# Patient Record
Sex: Male | Born: 1940 | Race: Black or African American | Hispanic: No | Smoking: Current every day smoker
Health system: Southern US, Community
[De-identification: ages and names within clinical notes are randomized; demographics above are authoritative.]

## PROBLEM LIST (undated history)

## (undated) DIAGNOSIS — N400 Enlarged prostate without lower urinary tract symptoms: Secondary | ICD-10-CM

## (undated) DIAGNOSIS — M109 Gout, unspecified: Secondary | ICD-10-CM

## (undated) DIAGNOSIS — N189 Chronic kidney disease, unspecified: Secondary | ICD-10-CM

## (undated) HISTORY — PX: CIRCUMCISION: SUR203

---

## 2001-06-01 ENCOUNTER — Other Ambulatory Visit: Admission: RE | Admit: 2001-06-01 | Discharge: 2001-06-01 | Payer: Self-pay | Admitting: Urology

## 2006-01-13 ENCOUNTER — Emergency Department (HOSPITAL_COMMUNITY): Admission: EM | Admit: 2006-01-13 | Discharge: 2006-01-13 | Payer: Self-pay | Admitting: Emergency Medicine

## 2010-03-20 ENCOUNTER — Emergency Department (HOSPITAL_COMMUNITY): Admission: EM | Admit: 2010-03-20 | Discharge: 2010-03-20 | Payer: Self-pay | Admitting: Emergency Medicine

## 2010-05-02 ENCOUNTER — Encounter: Payer: Self-pay | Admitting: Internal Medicine

## 2010-05-14 ENCOUNTER — Ambulatory Visit: Payer: Self-pay | Admitting: Internal Medicine

## 2010-05-14 ENCOUNTER — Ambulatory Visit (HOSPITAL_COMMUNITY): Admission: RE | Admit: 2010-05-14 | Discharge: 2010-05-14 | Payer: Self-pay | Admitting: Internal Medicine

## 2010-09-25 NOTE — Letter (Signed)
Summary: TRIAGE ORDER  TRIAGE ORDER   Imported By: Ave Filter 05/02/2010 16:43:15  _____________________________________________________________________  External Attachment:    Type:   Image     Comment:   External Document

## 2012-03-09 ENCOUNTER — Encounter (HOSPITAL_COMMUNITY): Payer: Self-pay | Admitting: Pharmacy Technician

## 2012-03-17 NOTE — Patient Instructions (Addendum)
20 Jesse Mccarthy  03/17/2012   Your procedure is scheduled on:  03/24/2012  Report to [facility] at  0730  AM.  Call this number if you have problems the morning of surgery: [phone]   Remember:   Do not eat food:After Midnight.  May have clear liquids:until Midnight   Take these medicines the morning of surgery with A SIP OF WATER: flomax,colchicine   Do not wear jewelry, make-up or nail polish.  Do not wear lotions, powders, or perfumes. You may wear deodorant.  Do not shave 48 hours prior to surgery. Men may shave face and neck.  Do not bring valuables to the hospital.  Contacts, dentures or bridgework may not be worn into surgery.  Leave suitcase in the car. After surgery it may be brought to your room.  For patients admitted to the hospital, checkout time is 11:00 AM the day of discharge.   Patients discharged the day of surgery will not be allowed to drive home.  Name and phone number of your driver: family  Special Instructions: CHG Shower Use Special Wash: 1/2 bottle night before surgery and 1/2 bottle morning of surgery.   Please read over the following fact sheets that you were given: Pain Booklet, MRSA Information, Surgical Site Infection Prevention, Anesthesia Post-op Instructions and Care and Recovery After Surgery PATIENT INSTRUCTIONS POST-ANESTHESIA  IMMEDIATELY FOLLOWING SURGERY:  Do not drive or operate machinery for the first twenty four hours after surgery.  Do not make any important decisions for twenty four hours after surgery or while taking narcotic pain medications or sedatives.  If you develop intractable nausea and vomiting or a severe headache please notify your doctor immediately.  FOLLOW-UP:  Please make an appointment with your surgeon as instructed. You do not need to follow up with anesthesia unless specifically instructed to do so.  WOUND CARE INSTRUCTIONS (if applicable):  Keep a dry clean dressing on the anesthesia/puncture wound site if there  is drainage.  Once the wound has quit draining you may leave it open to air.  Generally you should leave the bandage intact for twenty four hours unless there is drainage.  If the epidural site drains for more than 36-48 hours please call the anesthesia department.  QUESTIONS?:  Please feel free to call your physician or the hospital operator if you have any questions, and they will be happy to assist you.      Transurethral Resection of the Prostate Transurethral Resection of the Prostate (TURP) is often treatment for non-cancerous (benign) prostatic hyperplasia (BPH) or prostate cancer. BPH commonly begins in middle aged men and can cause symptoms at any age thereafter. The complications that stem from these problems, such as recurrent infection and bladder control and emptying problems, are often helped by this procedure. Both conditions usually cause the prostate to increase in size. TURP is a major surgery that removes part of the prostate gland. The goal is to remove enough prostate to allow for unobstructed flow of urine. TREATMENT This surgery (TURP procedure) is done using an instrument like a narrow telescope to look into your bladder. This is then used to remove enlarged pieces of your prostate, one piece at a time. This removes the blockage and makes it easier for you to urinate. This is called a transurethral resection of the prostate. A lesser procedure is sometimes done. In this procedure, small cuts are made in the prostate. This lessens the prostates pressure on the urethra. This is called a  transurethral incision (cut by the surgeon) of the prostate (TUIP). You will probably be comfortable soon after the operation, but it will take 10 to 12 weeks, or longer, for your prostate to heal after you leave the hospital.  LET YOUR CAREGIVER KNOW ABOUT:  Allergies.   Medications taken including herbs, eye drops, over the counter medications, and creams.   Use of steroids (by mouth or  creams).   Previous problems with anesthetics or Novocaine.   History of blood clots (thrombophlebitis).   History of bleeding or blood problems.   Previous surgery.   Previous prostate infections.   Other health problems.  RISKS AND COMPLICATIONS  Rare injury to the bowel (intestine).   Rare injury to the bladder or adjacent blood vessels.   Intestinal/bowel obstruction.   Scarring called "stricture" that cause later problems with the flow of urine.   Bleeding and the need for blood transfusion (more common in those with prostate cancer and those who have previously received radiation therapy).   Inability to control your urine (incontinence). This is more common in those with prostate cancer and those who have received radiation therapy.   Injury to one of the ureters (tubes that drain the kidneys into the bladder) and/or urethra (the tube that drains the bladder).   Injury to the capsule that holds the prostate. This can lead to leakage of fluid and urine into the belly (abdomen).   The operation can possibly lead to impotence. This is the inability to get an erection. Treatments are available for these types of problems.   Rare over absorption of the fluids used during the operation. This can then cause low blood levels of sodium, brain swelling, strain on the heart, and fluid accumulation in the lungs. This is more common in long procedures.   Infection.   Blood clots in the legs.   As with any major surgery, there is always the rare chance of a complicating stroke, heart attack, or other complications that will be discussed with you by the surgeon and anesthesiologist.  BEFORE THE PROCEDURE   You may be asked to temporarily adjust your diet. If so, your caregiver will give you specific recommendations.   If you are on blood thinners, stop taking them before the operation, or as your caregiver advises.   You should have nothing to eat or drink after midnight prior  to your surgery or as suggested by your caregiver. You may have a sip of water to take medications not stopped for the procedure  PROCEDURE  This operation is performed after you have been given a medication to help you sleep (anesthetic), or with a spinal block. The spinal block keeps you awake but numb from the waist down.  No cut or incision is needed. During the operation, your surgeon passes a viewing and cutting instrument (resectoscope) through the penis into the prostate gland. The instrument contains an electric cutting edge. From inside the prostate, this cutter is used to remove part of the prostate.  HOME CARE INSTRUCTIONS  For your own protection, observe the following precautions for 10 days after your operation.  You may go home with a catheter. Take care of it as directed. You will receive instruction on catheter care.   After catheter removal, empty the bladder whenever you feel a definite desire. Do not try to hold the urine for long periods of time.   For 10 days, avoid all lifting, straining, running, strenuous work, walks longer than a couple blocks, riding  in a car for extended periods, and sexual relations.   Take 2 tablespoons of heavy mineral oil or Metamucil night and morning for 3 or 4 days. After that, gradually reduce the dose to one or two teaspoons twice daily. Stop it after the stools have been normal for a week. If you become constipated, do not strain to move your bowels. You may use an enema. Notify your caregiver about problems.   Even after complete healing, you may continue to urinate once or twice during the night.   In addition to your usual medications, you may be given an antibiotic to take for 10-14 days. Notify your caregiver if you have any side effects or problems with the medication.   Avoid alcohol and caffeinated drinks for 2 weeks, as they are irritating to the bladder. Decaffeinated drinks are fine.   Eat a regular diet, avoiding spicy foods  for 2 weeks.   You may continue non-strenuous activities. It is always important to keep active after an operation. This lessens the chance of developing blood clots.   You may see some recurrence of blood in the urine after discharge from the hospital. Even a small amount of blood colors the urine very red. If this occurs, force fluids again as you did in the hospital. This is generally not a concern.  SEEK MEDICAL CARE IF:   You have chills or night sweats.   You are leaking around your catheter or have problems with your catheter.   You develop side effects that you think are coming from your medications.  SEEK IMMEDIATE MEDICAL CARE IF:   You are suddenly unable to urinate. This is an emergency.   You develop shortness of breath or chest pains.   Bleeding persists or clots develop.   You have a fever.   You develop pain in your back or over your lower belly (abdomen).   You develop pain or swelling in your legs.   You develop swelling in your abdomen or have a sudden weight gain.   The problems get worse rather than better.  Document Released: 08/12/2005 Document Revised: 08/01/2011 Document Reviewed: 02/28/2009 [facility] Patient Information 2012 [place], [place].

## 2012-03-18 ENCOUNTER — Encounter (HOSPITAL_COMMUNITY): Payer: Self-pay

## 2012-03-18 ENCOUNTER — Encounter (HOSPITAL_COMMUNITY)
Admission: RE | Admit: 2012-03-18 | Discharge: 2012-03-18 | Disposition: A | Discharge status: Home / Self Care | Payer: Medicare Other | Source: Ambulatory Visit | Attending: Urology | Admitting: Urology

## 2012-03-18 ENCOUNTER — Other Ambulatory Visit: Payer: Self-pay

## 2012-03-18 HISTORY — DX: Gout, unspecified: M10.9

## 2012-03-18 HISTORY — DX: Benign prostatic hyperplasia without lower urinary tract symptoms: N40.0

## 2012-03-18 LAB — SURGICAL PCR SCREEN: Staphylococcus aureus: NEGATIVE

## 2012-03-18 LAB — HEMOGLOBIN AND HEMATOCRIT, BLOOD: Hemoglobin: 13.4 g/dL (ref 13.0–17.0)

## 2012-03-18 LAB — BASIC METABOLIC PANEL
CO2: 19 mEq/L (ref 19–32)
Creatinine, Ser: 1.28 mg/dL (ref 0.50–1.35)
Potassium: 3.9 mEq/L (ref 3.5–5.1)

## 2012-03-23 ENCOUNTER — Other Ambulatory Visit (HOSPITAL_COMMUNITY): Payer: Self-pay | Admitting: Urology

## 2012-03-23 DIAGNOSIS — N400 Enlarged prostate without lower urinary tract symptoms: Secondary | ICD-10-CM

## 2012-03-23 DIAGNOSIS — R39198 Other difficulties with micturition: Secondary | ICD-10-CM

## 2012-03-24 ENCOUNTER — Encounter (HOSPITAL_COMMUNITY): Payer: Self-pay | Admitting: General Practice

## 2012-03-24 ENCOUNTER — Encounter (HOSPITAL_COMMUNITY): Payer: Self-pay | Admitting: Anesthesiology

## 2012-03-24 ENCOUNTER — Encounter (HOSPITAL_COMMUNITY)
Admission: RE | Disposition: A | Discharge status: Home / Self Care | Payer: Self-pay | Source: Ambulatory Visit | Attending: Urology

## 2012-03-24 ENCOUNTER — Encounter (HOSPITAL_COMMUNITY): Payer: Self-pay | Admitting: *Deleted

## 2012-03-24 ENCOUNTER — Observation Stay (HOSPITAL_COMMUNITY)
Admission: RE | Admit: 2012-03-24 | Discharge: 2012-03-25 | Disposition: A | Discharge status: Home / Self Care | Payer: Medicare Other | Source: Ambulatory Visit | Attending: Urology | Admitting: Urology

## 2012-03-24 ENCOUNTER — Ambulatory Visit (HOSPITAL_COMMUNITY)
Admission: RE | Admit: 2012-03-24 | Discharge: 2012-03-24 | Disposition: A | Discharge status: Home / Self Care | Payer: Medicare Other | Source: Ambulatory Visit | Attending: Urology | Admitting: Urology

## 2012-03-24 ENCOUNTER — Ambulatory Visit (HOSPITAL_COMMUNITY): Payer: Medicare Other | Admitting: Anesthesiology

## 2012-03-24 DIAGNOSIS — N138 Other obstructive and reflux uropathy: Principal | ICD-10-CM | POA: Insufficient documentation

## 2012-03-24 DIAGNOSIS — Z01812 Encounter for preprocedural laboratory examination: Secondary | ICD-10-CM | POA: Insufficient documentation

## 2012-03-24 DIAGNOSIS — R39198 Other difficulties with micturition: Secondary | ICD-10-CM

## 2012-03-24 DIAGNOSIS — R3916 Straining to void: Secondary | ICD-10-CM | POA: Insufficient documentation

## 2012-03-24 DIAGNOSIS — N320 Bladder-neck obstruction: Secondary | ICD-10-CM | POA: Insufficient documentation

## 2012-03-24 DIAGNOSIS — N400 Enlarged prostate without lower urinary tract symptoms: Secondary | ICD-10-CM

## 2012-03-24 DIAGNOSIS — N401 Enlarged prostate with lower urinary tract symptoms: Principal | ICD-10-CM | POA: Insufficient documentation

## 2012-03-24 DIAGNOSIS — Z23 Encounter for immunization: Secondary | ICD-10-CM | POA: Insufficient documentation

## 2012-03-24 DIAGNOSIS — Z01810 Encounter for preprocedural cardiovascular examination: Secondary | ICD-10-CM | POA: Insufficient documentation

## 2012-03-24 HISTORY — PX: TRANSURETHRAL RESECTION OF PROSTATE: SHX73

## 2012-03-24 SURGERY — TURP (TRANSURETHRAL RESECTION OF PROSTATE)
Anesthesia: Spinal | Site: Prostate | Wound class: Clean Contaminated

## 2012-03-24 MED ORDER — PROPOFOL 10 MG/ML IV EMUL
INTRAVENOUS | Status: AC
  Filled 2012-03-24: qty 20

## 2012-03-24 MED ORDER — PROPOFOL 10 MG/ML IV EMUL
INTRAVENOUS | Status: DC | PRN
  Administered 2012-03-24: 40 ug/kg/min via INTRAVENOUS

## 2012-03-24 MED ORDER — DEXTROSE-NACL 5-0.45 % IV SOLN
INTRAVENOUS | Status: DC
  Administered 2012-03-24 – 2012-03-25 (×2): via INTRAVENOUS

## 2012-03-24 MED ORDER — LACTATED RINGERS IV SOLN
INTRAVENOUS | Status: DC
  Administered 2012-03-24 (×2): via INTRAVENOUS

## 2012-03-24 MED ORDER — SODIUM CHLORIDE 0.9 % IR SOLN
Status: DC | PRN
  Administered 2012-03-24: 3000 mL

## 2012-03-24 MED ORDER — FENTANYL CITRATE 0.05 MG/ML IJ SOLN
INTRAMUSCULAR | Status: DC | PRN
  Administered 2012-03-24: 20 ug via INTRATHECAL
  Administered 2012-03-24: 50 ug via INTRAVENOUS

## 2012-03-24 MED ORDER — GLYCINE 1.5 % IR SOLN
Status: DC | PRN
  Administered 2012-03-24 (×13): 3000 mL

## 2012-03-24 MED ORDER — FENTANYL CITRATE 0.05 MG/ML IJ SOLN
INTRAMUSCULAR | Status: AC
  Filled 2012-03-24: qty 2

## 2012-03-24 MED ORDER — PROPOFOL 10 MG/ML IV BOLUS
INTRAVENOUS | Status: DC | PRN
  Administered 2012-03-24 (×2): 10 mg via INTRAVENOUS

## 2012-03-24 MED ORDER — MIDAZOLAM HCL 2 MG/2ML IJ SOLN
INTRAMUSCULAR | Status: AC
  Filled 2012-03-24: qty 2

## 2012-03-24 MED ORDER — MIDAZOLAM HCL 2 MG/2ML IJ SOLN
1.0000 mg | INTRAMUSCULAR | Status: DC | PRN
  Administered 2012-03-24 (×2): 2 mg via INTRAVENOUS

## 2012-03-24 MED ORDER — ONDANSETRON HCL 4 MG/2ML IJ SOLN: 4.0000 mg | Freq: Once | INTRAMUSCULAR | Status: DC | PRN

## 2012-03-24 MED ORDER — PNEUMOCOCCAL VAC POLYVALENT 25 MCG/0.5ML IJ INJ
0.5000 mL | INJECTION | INTRAMUSCULAR | Status: AC
  Administered 2012-03-25: 0.5 mL via INTRAMUSCULAR
  Filled 2012-03-24: qty 0.5

## 2012-03-24 MED ORDER — FENTANYL CITRATE 0.05 MG/ML IJ SOLN: 25.0000 ug | INTRAMUSCULAR | Status: DC | PRN

## 2012-03-24 MED ORDER — MORPHINE SULFATE 2 MG/ML IJ SOLN
2.0000 mg | INTRAMUSCULAR | Status: DC | PRN
  Administered 2012-03-24: 2 mg via INTRAVENOUS
  Filled 2012-03-24 (×2): qty 1

## 2012-03-24 MED ORDER — BUPIVACAINE HCL 0.75 % IJ SOLN
INTRAMUSCULAR | Status: DC | PRN
  Administered 2012-03-24: 13.5 mg via INTRATHECAL

## 2012-03-24 MED ORDER — MIDAZOLAM HCL 5 MG/5ML IJ SOLN
INTRAMUSCULAR | Status: DC | PRN
  Administered 2012-03-24: 2 mg via INTRAVENOUS

## 2012-03-24 MED ORDER — EPHEDRINE SULFATE 50 MG/ML IJ SOLN
INTRAMUSCULAR | Status: AC
  Filled 2012-03-24: qty 1

## 2012-03-24 MED ORDER — BUPIVACAINE IN DEXTROSE 0.75-8.25 % IT SOLN
INTRATHECAL | Status: AC
  Filled 2012-03-24: qty 2

## 2012-03-24 MED ORDER — DEXTROSE-NACL 5-0.45 % IV SOLN: INTRAVENOUS | Status: DC

## 2012-03-24 MED ORDER — STERILE WATER FOR IRRIGATION IR SOLN
Status: DC | PRN
  Administered 2012-03-24: 1000 mL

## 2012-03-24 MED ORDER — EPHEDRINE SULFATE 50 MG/ML IJ SOLN
INTRAMUSCULAR | Status: DC | PRN
  Administered 2012-03-24: 5 mg via INTRAVENOUS
  Administered 2012-03-24 (×3): 10 mg via INTRAVENOUS
  Administered 2012-03-24: 5 mg via INTRAVENOUS

## 2012-03-24 SURGICAL SUPPLY — 42 items
BAG DECANTER FOR FLEXI CONT (MISCELLANEOUS) ×2 IMPLANT
BAG DRAIN URO TABLE W/ADPT NS (DRAPE) ×2 IMPLANT
BAG DRN 8 ADPR NS SKTRN CSTL (DRAPE) ×1
BAG DRN URN TUBE DRIP CHMBR (OSTOMY) ×1
BAG URINE DRAIN TURP 4L (OSTOMY) ×2 IMPLANT
CABLE HI FREQUENCY MONOPOLAR (ELECTROSURGICAL) ×2 IMPLANT
CATH 3WAY 30CC 24FR (CATHETERS) ×1 IMPLANT
CATH FOLEY 3WAY 30CC 20FR (CATHETERS) ×1 IMPLANT
CATH FOLEY 3WAY 30CC 22F (CATHETERS) ×3 IMPLANT
CATH URET WHISTLE 5FR 28IN (CATHETERS) ×1 IMPLANT
CLOTH BEACON ORANGE TIMEOUT ST (SAFETY) ×2 IMPLANT
CONNECTOR 5 IN 1 STRAIGHT STRL (MISCELLANEOUS) ×2 IMPLANT
DRAPE STERI URO 23X35 APER SZ5 (DRAPE) ×2 IMPLANT
ELECT CUT LOOP C-MAX 27FR .012 (CUTTING LOOP) ×4
ELECT REM PT RETURN 9FT ADLT (ELECTROSURGICAL) ×2
ELECTRODE CUT LP CMX 27FR .012 (CUTTING LOOP) IMPLANT
ELECTRODE REM PT RTRN 9FT ADLT (ELECTROSURGICAL) ×1 IMPLANT
FLOOR PAD 36X40 (MISCELLANEOUS)
FORMALIN 10 PREFIL 480ML (MISCELLANEOUS) ×2 IMPLANT
GLOVE BIO SURGEON STRL SZ7 (GLOVE) ×2 IMPLANT
GLOVE BIOGEL PI IND STRL 7.0 (GLOVE) IMPLANT
GLOVE BIOGEL PI IND STRL 7.5 (GLOVE) IMPLANT
GLOVE BIOGEL PI INDICATOR 7.0 (GLOVE) ×1
GLOVE BIOGEL PI INDICATOR 7.5 (GLOVE) ×1
GLOVE ECLIPSE 6.5 STRL STRAW (GLOVE) ×1 IMPLANT
GLOVE ECLIPSE 7.0 STRL STRAW (GLOVE) ×1 IMPLANT
GLOVE EXAM NITRILE MD LF STRL (GLOVE) ×1 IMPLANT
GLYCINE 1.5% IRRIG UROMATIC (IV SOLUTION) ×17 IMPLANT
GOWN STRL REIN XL XLG (GOWN DISPOSABLE) ×3 IMPLANT
IV NS IRRIG 3000ML ARTHROMATIC (IV SOLUTION) ×2 IMPLANT
KIT ROOM TURNOVER AP CYSTO (KITS) ×2 IMPLANT
MANIFOLD NEPTUNE II (INSTRUMENTS) ×2 IMPLANT
PACK CYSTO (CUSTOM PROCEDURE TRAY) ×2 IMPLANT
PAD ARMBOARD 7.5X6 YLW CONV (MISCELLANEOUS) ×2 IMPLANT
PAD FLOOR 36X40 (MISCELLANEOUS) ×1 IMPLANT
SET IRRIGATING DISP (SET/KITS/TRAYS/PACK) ×2 IMPLANT
SYR 30ML LL (SYRINGE) ×2 IMPLANT
SYRINGE IRR TOOMEY STRL 70CC (SYRINGE) ×1 IMPLANT
TOWEL OR 17X26 4PK STRL BLUE (TOWEL DISPOSABLE) ×2 IMPLANT
WATER STERILE IRR 1000ML POUR (IV SOLUTION) ×2 IMPLANT
XPEEDA 550 SIDEFIRING FIBER (MISCELLANEOUS) IMPLANT
YANKAUER SUCT BULB TIP 10FT TU (MISCELLANEOUS) ×2 IMPLANT

## 2012-03-24 NOTE — Brief Op Note (Signed)
03/24/2012  2:21 PM  PATIENT:  Arvilla Market  71 y.o. male  PRE-OPERATIVE DIAGNOSIS:  benign prostate hypertrophy  POST-OPERATIVE DIAGNOSIS:  benign prostate hypertrophy  PROCEDURE:  Procedure(s) (LRB): TRANSURETHRAL RESECTION OF THE PROSTATE (TURP) (N/A)  SURGEON:  Surgeon(s) and Role:    * Ky Barban, MD - Primary  PHYSICIAN ASSISTANT:   ASSISTANTS: none   ANESTHESIA:   spinal  EBL:  Total I/O In: 1400 [I.V.:1400] Out: -   BLOOD ADMINISTERED:none  DRAINS: Urinary Catheter (Foley)   LOCAL MEDICATIONS USED:  NONE  SPECIMEN:  Source of Specimen:  prostate chips  DISPOSITION OF SPECIMEN:  PATHOLOGY  COUNTS:  YES  TOURNIQUET:  * No tourniquets in log *  DICTATION: .Other Dictation: Dictation Number [id number]  PLAN OF CARE: Admit for overnight observation  PATIENT DISPOSITION:  PACU - hemodynamically stable.   Delay start of Pharmacological VTE agent (>24hrs) due to surgical blood loss or risk of bleeding:

## 2012-03-24 NOTE — Anesthesia Postprocedure Evaluation (Addendum)
  Anesthesia Post-op Note  Patient: Jesse Mccarthy  Procedure(s) Performed: Procedure(s) (LRB): TRANSURETHRAL RESECTION OF THE PROSTATE (TURP) (N/A)  Patient Location: PACU  Anesthesia Type: Spinal  Level of Consciousness: awake and alert   Airway and Oxygen Therapy: Patient Spontanous Breathing  Post-op Pain: none  Post-op Assessment: Post-op Vital signs reviewed, Patient's Cardiovascular Status Stable, Respiratory Function Stable, Patent Airway and No signs of Nausea or vomiting  Post-op Vital Signs: Reviewed and stable  Complications: No apparent anesthesia complications 03/25/12  Patient doing well, no apparent anesthesia complications.

## 2012-03-24 NOTE — H&P (Signed)
Jesse Mccarthy, Jesse Mccarthy                 ACCOUNT NO.:  [id number]  MEDICAL RECORD NO.:  [id number]  LOCATION:                                 FACILITY:  PHYSICIAN:  Ky Barban, M.D.DATE OF BIRTH:  [birth year]  DATE OF ADMISSION:  03/24/2012 DATE OF DISCHARGE:  LH                             HISTORY & PHYSICAL   CHIEF COMPLAINT:  Symptoms of prostatism.  HISTORY OF PRESENT ILLNESS:  A 71 year old gentleman has been having symptoms of prostatism for long time.  He has been on Flomax, but it is not helping.  His usual score is 7 and workup shows that he has enlarged prostate with bladder neck obstruction.  He has trilobar hypertrophy and his residual urine was 400 mL with bladder scan.  He says the problem is getting worse.  He is having nocturia x2-3.  He has Flomax maybe helped some little bit, but not much.  He does have some dysuria also.  PAST MEDICAL HISTORY:  He has gout, takes medicine.  No diabetes or hypertension.  PERSONAL HISTORY:  He does smoke and drinks very little.  REVIEW OF SYSTEMS:  Unremarkable.  PHYSICAL EXAMINATION:  GENERAL:  Well-nourished, well-developed male, not in acute distress. VITAL SIGNS:  Blood pressure 134/85, temperature 97.8. CENTRAL NERVOUS SYSTEM:  No gross neurological deficit. HEAD, NECK, EYE, ENT:  Negative. CHEST:  Symmetrical. HEART:  Regular sinus rhythm.  No murmur. ABDOMEN:  Soft, flat.  Liver, spleen, and kidneys are not palpable.  No CVA tenderness. GU:  External genitalia is circumcised, meatus adequate.  Testicles are normal. RECTAL:  Normal sphincter tone.  No rectal mass.  Prostate 2+ smooth and firm.  IMPRESSION: 1. 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 2. Gout.  PLAN:  TUR prostate.  Then keep him overnight in the hospital. Procedure limitation discussed with the patient.  He understands and want me to go ahead and proceed with it.  Note, he does take medicines for allergies over the counter  medication, but he says problem is even existing before that.     Ky Barban, M.D.     MIJ/MEDQ  D:  03/23/2012  T:  03/24/2012  Job:  [id number]

## 2012-03-24 NOTE — Transfer of Care (Signed)
Immediate Anesthesia Transfer of Care Note  Patient: Jesse Mccarthy  Procedure(s) Performed: Procedure(s) (LRB): TRANSURETHRAL RESECTION OF THE PROSTATE (TURP) (N/A)  Patient Location: PACU  Anesthesia Type: Spinal  Level of Consciousness: awake, alert  and oriented  Airway & Oxygen Therapy: Patient Spontanous Breathing  Post-op Assessment: Report given to PACU RN  Post vital signs: Reviewed and stable  Complications: No apparent anesthesia complications

## 2012-03-24 NOTE — Brief Op Note (Signed)
03/24/2012  2:26 PM  PATIENT:  Jesse Mccarthy  71 y.o. male  PRE-OPERATIVE DIAGNOSIS:  benign prostate hypertrophy  POST-OPERATIVE DIAGNOSIS:  benign prostate hypertrophy  PROCEDURE:  Procedure(s) (LRB): TRANSURETHRAL RESECTION OF THE PROSTATE (TURP) (N/A)  SURGEON:  Surgeon(s) and Role:    * Ky Barban, MD - Primary  PHYSICIAN ASSISTANT:   ASSISTANTS: none   ANESTHESIA:   spinal  EBL:  Total I/O In: 1400 [I.V.:1400] Out: -   BLOOD ADMINISTERED:none  DRAINS: Urinary Catheter (Foley)   LOCAL MEDICATIONS USED:  NONE  SPECIMEN:  Source of Specimen:  prostate chips  DISPOSITION OF SPECIMEN:  PATHOLOGY  COUNTS:  YES  TOURNIQUET:  * No tourniquets in log *  DICTATION: .Other Dictation: Dictation Number [id number]  PLAN OF CARE: Admit for overnight observation  PATIENT DISPOSITION:  PACU - hemodynamically stable.   Delay start of Pharmacological VTE agent (>24hrs) due to surgical blood loss or risk of bleeding:

## 2012-03-24 NOTE — Progress Notes (Signed)
No change in H&P on reexamination. 

## 2012-03-24 NOTE — Anesthesia Procedure Notes (Signed)
Spinal  Patient location during procedure: OR Start time: 03/24/2012 12:31 PM Staffing CRNA/Resident: Glynn Octave E Preanesthetic Checklist Completed: patient identified, site marked, surgical consent, pre-op evaluation, timeout performed, IV checked, risks and benefits discussed and monitors and equipment checked Spinal Block Patient position: sitting Prep: Betadine Patient monitoring: heart rate, cardiac monitor, continuous pulse ox and blood pressure Approach: midline Location: L3-4 Injection technique: single-shot Needle Needle type: Spinocan  Needle gauge: 22 G Needle length: 9 cm Assessment Sensory level: T8 Additional Notes One attempt, Tray #[id number], Exp.06/14

## 2012-03-24 NOTE — Anesthesia Preprocedure Evaluation (Signed)
Anesthesia Evaluation  Patient identified by MRN, date of birth, ID band Patient awake    Reviewed: Allergy & Precautions, H&P , NPO status , Patient's Chart, lab work & pertinent test results  History of Anesthesia Complications Negative for: history of anesthetic complications  Airway Mallampati: I      Dental  (+) Teeth Intact, Poor Dentition, Missing and Dental Advisory Given   Pulmonary Current Smoker,  breath sounds clear to auscultation        Cardiovascular negative cardio ROS  Rhythm:Regular Rate:Normal     Neuro/Psych    GI/Hepatic GERD-  Medicated and Controlled,  Endo/Other  Gout   Renal/GU      Musculoskeletal   Abdominal   Peds  Hematology   Anesthesia Other Findings   Reproductive/Obstetrics                           Anesthesia Physical Anesthesia Plan  ASA: II  Anesthesia Plan: Spinal   Post-op Pain Management:    Induction:   Airway Management Planned: Nasal Cannula  Additional Equipment:   Intra-op Plan:   Post-operative Plan:   Informed Consent: I have reviewed the patients History and Physical, chart, labs and discussed the procedure including the risks, benefits and alternatives for the proposed anesthesia with the patient or authorized representative who has indicated his/her understanding and acceptance.     Plan Discussed with:   Anesthesia Plan Comments: (Marcaine )        Anesthesia Quick Evaluation

## 2012-03-25 LAB — BASIC METABOLIC PANEL
BUN: 9 mg/dL (ref 6–23)
Chloride: 105 mEq/L (ref 96–112)
GFR calc Af Amer: 74 mL/min — ABNORMAL LOW (ref >90)
Potassium: 4.2 mEq/L (ref 3.5–5.1)
Sodium: 138 mEq/L (ref 135–145)

## 2012-03-25 LAB — CBC
HCT: 38.2 % — ABNORMAL LOW (ref 39.0–52.0)
RDW: 15 % (ref 11.5–15.5)
WBC: 15 10*3/uL — ABNORMAL HIGH (ref 4.0–10.5)

## 2012-03-25 NOTE — Addendum Note (Signed)
Addendum  created 03/25/12 1136 by Moshe Salisbury, CRNA   Modules edited:Notes Section

## 2012-03-25 NOTE — Care Management Note (Unsigned)
    Page 1 of 1   03/25/2012     1:35:27 PM   CARE MANAGEMENT NOTE 03/25/2012  Patient:  Jesse Mccarthy, Jesse Mccarthy   Account Number:  [id number]  Date Initiated:  03/25/2012  Documentation initiated by:  Sharrie Rothman  Subjective/Objective Assessment:   Pt admitted from home s/p TURP. Pt lives with his brother and will return home with him at discharge. Pt has a cane and walker for home use. Pt is fairly independent with ADL's.     Action/Plan:   IF pt d/c'd with foley catheter, pt would benefit from [facility]. Pt is agreeable at this time.   Anticipated DC Date:  03/25/2012   Anticipated DC Plan:  HOME W HOME HEALTH SERVICES      DC Planning Services  CM consult      Choice offered to / List presented to:             Status of service:  In process, will continue to follow Medicare Important Message given?   (If response is "NO", the following Medicare IM given date fields will be blank) Date Medicare IM given:   Date Additional Medicare IM given:    Discharge Disposition:    Per UR Regulation:    If discussed at Long Length of Stay Meetings, dates discussed:    Comments:  03/25/12 1334 Arlyss Queen, RN BSN CM

## 2012-03-25 NOTE — Progress Notes (Signed)
UR Chart Review Completed  

## 2012-03-25 NOTE — Progress Notes (Signed)
Discharge instructions given to patient with no questions. Verbalized understanding of catheter care. Patient to follow up Monday with Dr. Jerre Simon. Patient taken out of facility by staff.

## 2012-03-25 NOTE — Op Note (Signed)
Jesse Mccarthy, Jesse Mccarthy                 ACCOUNT NO.:  [id number]  MEDICAL RECORD NO.:  [id number]  LOCATION:  A325                          FACILITY:  [facility]  PHYSICIAN:  Ky Barban, M.D.DATE OF BIRTH:  July 08, 1941  DATE OF PROCEDURE: DATE OF DISCHARGE:                              OPERATIVE REPORT   PREOPERATIVE DIAGNOSIS:  Benign prostatic hypertrophy.  POSTOPERATIVE DIAGNOSIS:  Benign prostatic hypertrophy.  PROCEDURE:  TUR prostate.  ANESTHESIA:  Spinal.  PROCEDURE:  The patient under spinal anesthesia in lithotomy position. After usual prep and drape, a #28 Iglesias resectoscope was introduced into the bladder.  It was inspected.  He has about 300 mL of residual urine.  Very large bladder with mild trabeculated bladder.  He has a very large adenoma.  Most of the adenoma is located at between 2 and 11 o'clock position anteriorly it is just like a big median lobe which was growing into the bladder at 12 o'clock position.  He does have a median lobe also with lateral lobe hypertrophy.  Resectoscope was pulled back in mid prostatic urethra and median lobe was resected up to the level of the mid prostatic urethra.  Then, bladder neck was circumferentially resected at 12 o'clock position.  He has a large adenoma with some difficulty in using the camera, I was able to resect most of it.  Then, my resectoscope goes in and out of the bladder more easily. Resectoscope was pulled back at the level of the verumontanum, rotated to the 11 o'clock position.  Resection of the right lobe was started between 11 and 7 o'clock position.  Similarly, the left lobe was resected between 1 and 5 o'clock position.  There is still considerable tissue in the lateral lobe and near the apex but there is no visual obstruction.  Chips were evacuated.  Bleeders were coagulated.  The resectoscope was removed and 22-Foley catheter left in for drainage. CBI started which is cleared.  The patient left the  operating room in satisfactory condition.     Ky Barban, M.D.     MIJ/MEDQ  D:  03/24/2012  T:  03/25/2012  Job:  [id number]

## 2012-03-26 ENCOUNTER — Encounter (HOSPITAL_COMMUNITY): Payer: Self-pay | Admitting: Urology

## 2012-03-26 NOTE — Discharge Summary (Signed)
Discharge [id number].

## 2012-03-27 NOTE — Discharge Summary (Signed)
NAMEDEQUON, SCHNEBLY                 ACCOUNT NO.:  [id number]  MEDICAL RECORD NO.:  [id number]  LOCATION:                                 FACILITY:  PHYSICIAN:  Ky Barban, M.D.DATE OF BIRTH:  09-13-1940  DATE OF ADMISSION:  03/24/2012 DATE OF DISCHARGE:  07/31/2013LH                              DISCHARGE SUMMARY   HOSPITAL SUMMARY:  A 71 year old gentleman was admitted with symptoms of prostatism.  He has been on Flomax.  Cystoscopy showed that he has trilobar hypertrophy causing bladder neck obstruction.  So, he has a residual urine of 400 mL and he is still very symptomatic, so I advised him to undergo TUR prostate for which he underwent routine preadmission workup.  CBC, BMET was normal.  He was taken to the operating room, where TUR prostate was done.  He has a large median lobe.  He has another lobe at 12 o'clock position causing bladder neck obstruction, that was resected also.  Most of the adenoma was resected.  Prostatic urethra was wide open.  He was kept overnight.  __________ was clear. He is up and walking around.  Regular diet, so I decided to discharge him home with a Foley catheter, which I will take it out on Monday in the office.  FINAL DISCHARGE DIAGNOSIS:  Benign prostatic hyperplasia, his pathology report is still pending.  DISCHARGE MEDICATION:  He is advised to continue his usual medicines at home.  He takes medicine for his gout and I have told not to take any aspirin, so I will see him back in the office on Monday to remove his Foley catheter.  I have told him if he has fever or bleeding to let me know.     Ky Barban, M.D.     MIJ/MEDQ  D:  03/26/2012  T:  03/27/2012  Job:  [id number]

## 2014-06-01 ENCOUNTER — Telehealth: Payer: Self-pay | Admitting: General Practice

## 2014-06-01 NOTE — Telephone Encounter (Signed)
Patient called to schedule his screening tcs.  Please call him at [phone]  [facility]

## 2014-06-01 NOTE — Telephone Encounter (Signed)
Pt was referred by Dr. Felecia ShellingFanta for colonoscopy. His last one was 05/15/2010 by Dr. Jena Gaussourk.  His next was recommended in 10 years. Pt is not having any problems and no family hx of colon cancer. He will call if he needs something before 04/2020.  Darl PikesSusan, please make sure he is on recall.

## 2014-06-02 NOTE — Telephone Encounter (Signed)
Reminder in epic °

## 2014-12-20 ENCOUNTER — Other Ambulatory Visit (HOSPITAL_COMMUNITY): Payer: Self-pay | Admitting: Nephrology

## 2014-12-20 DIAGNOSIS — N183 Chronic kidney disease, stage 3 unspecified: Secondary | ICD-10-CM

## 2015-01-04 ENCOUNTER — Ambulatory Visit (HOSPITAL_COMMUNITY)
Admission: RE | Admit: 2015-01-04 | Discharge: 2015-01-04 | Disposition: A | Discharge status: Home / Self Care | Payer: Commercial Managed Care - HMO | Source: Ambulatory Visit | Attending: Nephrology | Admitting: Nephrology

## 2015-01-04 DIAGNOSIS — N183 Chronic kidney disease, stage 3 unspecified: Secondary | ICD-10-CM

## 2015-05-25 ENCOUNTER — Ambulatory Visit (INDEPENDENT_AMBULATORY_CARE_PROVIDER_SITE_OTHER): Payer: Commercial Managed Care - HMO | Admitting: Otolaryngology

## 2015-05-25 DIAGNOSIS — H906 Mixed conductive and sensorineural hearing loss, bilateral: Secondary | ICD-10-CM

## 2015-05-25 DIAGNOSIS — J342 Deviated nasal septum: Secondary | ICD-10-CM | POA: Diagnosis not present

## 2015-05-25 DIAGNOSIS — J343 Hypertrophy of nasal turbinates: Secondary | ICD-10-CM | POA: Diagnosis not present

## 2015-05-25 DIAGNOSIS — H6983 Other specified disorders of Eustachian tube, bilateral: Secondary | ICD-10-CM

## 2015-06-22 ENCOUNTER — Ambulatory Visit (INDEPENDENT_AMBULATORY_CARE_PROVIDER_SITE_OTHER): Payer: Commercial Managed Care - HMO | Admitting: Otolaryngology

## 2015-06-22 DIAGNOSIS — H6983 Other specified disorders of Eustachian tube, bilateral: Secondary | ICD-10-CM

## 2015-06-22 DIAGNOSIS — H906 Mixed conductive and sensorineural hearing loss, bilateral: Secondary | ICD-10-CM | POA: Diagnosis not present

## 2015-07-27 ENCOUNTER — Ambulatory Visit (INDEPENDENT_AMBULATORY_CARE_PROVIDER_SITE_OTHER): Payer: Commercial Managed Care - HMO | Admitting: Otolaryngology

## 2015-07-27 DIAGNOSIS — H6983 Other specified disorders of Eustachian tube, bilateral: Secondary | ICD-10-CM | POA: Diagnosis not present

## 2015-07-27 DIAGNOSIS — H6523 Chronic serous otitis media, bilateral: Secondary | ICD-10-CM | POA: Diagnosis not present

## 2015-07-27 DIAGNOSIS — H900 Conductive hearing loss, bilateral: Secondary | ICD-10-CM | POA: Diagnosis not present

## 2015-09-07 ENCOUNTER — Ambulatory Visit (INDEPENDENT_AMBULATORY_CARE_PROVIDER_SITE_OTHER): Payer: Commercial Managed Care - HMO | Admitting: Otolaryngology

## 2015-09-07 DIAGNOSIS — H6981 Other specified disorders of Eustachian tube, right ear: Secondary | ICD-10-CM | POA: Diagnosis not present

## 2015-09-07 DIAGNOSIS — H6121 Impacted cerumen, right ear: Secondary | ICD-10-CM

## 2015-09-07 DIAGNOSIS — H9071 Mixed conductive and sensorineural hearing loss, unilateral, right ear, with unrestricted hearing on the contralateral side: Secondary | ICD-10-CM | POA: Diagnosis not present

## 2015-09-07 DIAGNOSIS — H6521 Chronic serous otitis media, right ear: Secondary | ICD-10-CM

## 2015-11-09 ENCOUNTER — Ambulatory Visit (INDEPENDENT_AMBULATORY_CARE_PROVIDER_SITE_OTHER): Payer: Commercial Managed Care - HMO | Admitting: Otolaryngology

## 2015-11-09 DIAGNOSIS — H9071 Mixed conductive and sensorineural hearing loss, unilateral, right ear, with unrestricted hearing on the contralateral side: Secondary | ICD-10-CM

## 2015-11-09 DIAGNOSIS — H6983 Other specified disorders of Eustachian tube, bilateral: Secondary | ICD-10-CM

## 2016-01-03 ENCOUNTER — Other Ambulatory Visit (HOSPITAL_COMMUNITY): Payer: Self-pay | Admitting: Internal Medicine

## 2016-01-03 DIAGNOSIS — I739 Peripheral vascular disease, unspecified: Secondary | ICD-10-CM

## 2016-01-09 ENCOUNTER — Ambulatory Visit (HOSPITAL_COMMUNITY)
Admission: RE | Admit: 2016-01-09 | Discharge: 2016-01-09 | Disposition: A | Discharge status: Home / Self Care | Payer: Commercial Managed Care - HMO | Source: Ambulatory Visit | Attending: Internal Medicine | Admitting: Internal Medicine

## 2016-02-08 ENCOUNTER — Ambulatory Visit (INDEPENDENT_AMBULATORY_CARE_PROVIDER_SITE_OTHER): Payer: Commercial Managed Care - HMO | Admitting: Otolaryngology

## 2016-02-08 DIAGNOSIS — H6981 Other specified disorders of Eustachian tube, right ear: Secondary | ICD-10-CM | POA: Diagnosis not present

## 2016-02-08 DIAGNOSIS — H903 Sensorineural hearing loss, bilateral: Secondary | ICD-10-CM | POA: Diagnosis not present

## 2016-06-19 ENCOUNTER — Ambulatory Visit (HOSPITAL_COMMUNITY): Payer: Commercial Managed Care - HMO

## 2016-06-19 ENCOUNTER — Other Ambulatory Visit (HOSPITAL_COMMUNITY): Payer: Self-pay | Admitting: Nephrology

## 2016-06-19 DIAGNOSIS — M79604 Pain in right leg: Secondary | ICD-10-CM

## 2016-06-19 DIAGNOSIS — R609 Edema, unspecified: Secondary | ICD-10-CM

## 2016-06-20 ENCOUNTER — Ambulatory Visit (HOSPITAL_COMMUNITY)
Admission: RE | Admit: 2016-06-20 | Discharge: 2016-06-20 | Disposition: A | Discharge status: Home / Self Care | Payer: Commercial Managed Care - HMO | Source: Ambulatory Visit | Attending: Nephrology | Admitting: Nephrology

## 2016-06-20 DIAGNOSIS — M79604 Pain in right leg: Secondary | ICD-10-CM | POA: Diagnosis not present

## 2016-06-20 DIAGNOSIS — M7989 Other specified soft tissue disorders: Secondary | ICD-10-CM | POA: Diagnosis not present

## 2016-06-20 DIAGNOSIS — R609 Edema, unspecified: Secondary | ICD-10-CM

## 2016-07-17 ENCOUNTER — Emergency Department (HOSPITAL_COMMUNITY): Payer: Commercial Managed Care - HMO

## 2016-07-17 ENCOUNTER — Inpatient Hospital Stay (HOSPITAL_COMMUNITY)
Admission: EM | Admit: 2016-07-17 | Discharge: 2016-07-26 | DRG: 682 | Disposition: E | Payer: Commercial Managed Care - HMO | Attending: Internal Medicine | Admitting: Internal Medicine

## 2016-07-17 ENCOUNTER — Encounter (HOSPITAL_COMMUNITY): Payer: Self-pay | Admitting: Emergency Medicine

## 2016-07-17 DIAGNOSIS — R600 Localized edema: Secondary | ICD-10-CM | POA: Diagnosis present

## 2016-07-17 DIAGNOSIS — N401 Enlarged prostate with lower urinary tract symptoms: Secondary | ICD-10-CM | POA: Diagnosis present

## 2016-07-17 DIAGNOSIS — J9621 Acute and chronic respiratory failure with hypoxia: Secondary | ICD-10-CM | POA: Diagnosis not present

## 2016-07-17 DIAGNOSIS — N138 Other obstructive and reflux uropathy: Secondary | ICD-10-CM | POA: Diagnosis present

## 2016-07-17 DIAGNOSIS — R748 Abnormal levels of other serum enzymes: Secondary | ICD-10-CM | POA: Diagnosis present

## 2016-07-17 DIAGNOSIS — N189 Chronic kidney disease, unspecified: Secondary | ICD-10-CM

## 2016-07-17 DIAGNOSIS — I509 Heart failure, unspecified: Secondary | ICD-10-CM | POA: Diagnosis present

## 2016-07-17 DIAGNOSIS — N170 Acute kidney failure with tubular necrosis: Secondary | ICD-10-CM | POA: Diagnosis present

## 2016-07-17 DIAGNOSIS — N358 Other urethral stricture: Secondary | ICD-10-CM | POA: Diagnosis not present

## 2016-07-17 DIAGNOSIS — R778 Other specified abnormalities of plasma proteins: Secondary | ICD-10-CM

## 2016-07-17 DIAGNOSIS — N359 Urethral stricture, unspecified: Secondary | ICD-10-CM | POA: Diagnosis present

## 2016-07-17 DIAGNOSIS — M898X9 Other specified disorders of bone, unspecified site: Secondary | ICD-10-CM | POA: Diagnosis present

## 2016-07-17 DIAGNOSIS — N400 Enlarged prostate without lower urinary tract symptoms: Secondary | ICD-10-CM | POA: Diagnosis present

## 2016-07-17 DIAGNOSIS — N182 Chronic kidney disease, stage 2 (mild): Secondary | ICD-10-CM | POA: Diagnosis present

## 2016-07-17 DIAGNOSIS — M109 Gout, unspecified: Secondary | ICD-10-CM | POA: Diagnosis present

## 2016-07-17 DIAGNOSIS — R7989 Other specified abnormal findings of blood chemistry: Secondary | ICD-10-CM

## 2016-07-17 DIAGNOSIS — N320 Bladder-neck obstruction: Secondary | ICD-10-CM | POA: Diagnosis present

## 2016-07-17 DIAGNOSIS — J189 Pneumonia, unspecified organism: Secondary | ICD-10-CM | POA: Diagnosis not present

## 2016-07-17 DIAGNOSIS — E875 Hyperkalemia: Secondary | ICD-10-CM | POA: Diagnosis not present

## 2016-07-17 DIAGNOSIS — R0600 Dyspnea, unspecified: Secondary | ICD-10-CM | POA: Diagnosis not present

## 2016-07-17 DIAGNOSIS — N179 Acute kidney failure, unspecified: Secondary | ICD-10-CM | POA: Diagnosis present

## 2016-07-17 DIAGNOSIS — Z9079 Acquired absence of other genital organ(s): Secondary | ICD-10-CM

## 2016-07-17 DIAGNOSIS — R064 Hyperventilation: Secondary | ICD-10-CM

## 2016-07-17 DIAGNOSIS — I950 Idiopathic hypotension: Secondary | ICD-10-CM | POA: Diagnosis present

## 2016-07-17 DIAGNOSIS — Z789 Other specified health status: Secondary | ICD-10-CM

## 2016-07-17 DIAGNOSIS — R338 Other retention of urine: Secondary | ICD-10-CM | POA: Diagnosis not present

## 2016-07-17 DIAGNOSIS — R52 Pain, unspecified: Secondary | ICD-10-CM

## 2016-07-17 DIAGNOSIS — F17210 Nicotine dependence, cigarettes, uncomplicated: Secondary | ICD-10-CM | POA: Diagnosis present

## 2016-07-17 DIAGNOSIS — R001 Bradycardia, unspecified: Secondary | ICD-10-CM | POA: Diagnosis not present

## 2016-07-17 DIAGNOSIS — E869 Volume depletion, unspecified: Secondary | ICD-10-CM | POA: Diagnosis present

## 2016-07-17 HISTORY — DX: Chronic kidney disease, unspecified: N18.9

## 2016-07-17 LAB — BASIC METABOLIC PANEL
Anion gap: 8 (ref 5–15)
BUN: 36 mg/dL — AB (ref 6–20)
CO2: 16 mmol/L — ABNORMAL LOW (ref 22–32)
CREATININE: 2.94 mg/dL — AB (ref 0.61–1.24)
Calcium: 9.1 mg/dL (ref 8.9–10.3)
Chloride: 115 mmol/L — ABNORMAL HIGH (ref 101–111)
GFR calc Af Amer: 23 mL/min — ABNORMAL LOW (ref >60)
GFR, EST NON AFRICAN AMERICAN: 19 mL/min — AB (ref >60)
GLUCOSE: 139 mg/dL — AB (ref 65–99)
Potassium: 4.6 mmol/L (ref 3.5–5.1)
SODIUM: 139 mmol/L (ref 135–145)

## 2016-07-17 LAB — TROPONIN I
TROPONIN I: 0.22 ng/mL — AB (ref <0.03)
TROPONIN I: 0.21 ng/mL — AB (ref <0.03)
Troponin I: 0.19 ng/mL (ref <0.03)

## 2016-07-17 LAB — MAGNESIUM: MAGNESIUM: 1.8 mg/dL (ref 1.7–2.4)

## 2016-07-17 LAB — PHOSPHORUS: PHOSPHORUS: 3.8 mg/dL (ref 2.5–4.6)

## 2016-07-17 LAB — BRAIN NATRIURETIC PEPTIDE: B Natriuretic Peptide: 1213 pg/mL — ABNORMAL HIGH (ref 0.0–100.0)

## 2016-07-17 LAB — TSH: TSH: 4.258 u[IU]/mL (ref 0.350–4.500)

## 2016-07-17 LAB — CALCIUM: CALCIUM: 8.9 mg/dL (ref 8.9–10.3)

## 2016-07-17 MED ORDER — ONDANSETRON HCL 4 MG/2ML IJ SOLN: 4.0000 mg | Freq: Four times a day (QID) | INTRAMUSCULAR | Status: DC | PRN

## 2016-07-17 MED ORDER — PANTOPRAZOLE SODIUM 40 MG PO TBEC
40.0000 mg | DELAYED_RELEASE_TABLET | Freq: Every day | ORAL | Status: DC
  Administered 2016-07-18 – 2016-07-22 (×5): 40 mg via ORAL
  Filled 2016-07-17 (×5): qty 1

## 2016-07-17 MED ORDER — TAMSULOSIN HCL 0.4 MG PO CAPS
0.4000 mg | ORAL_CAPSULE | Freq: Every day | ORAL | Status: DC
  Administered 2016-07-18 – 2016-07-19 (×2): 0.4 mg via ORAL
  Filled 2016-07-17 (×2): qty 1

## 2016-07-17 MED ORDER — ACETAMINOPHEN 325 MG PO TABS
650.0000 mg | ORAL_TABLET | ORAL | Status: DC | PRN
  Administered 2016-07-17 – 2016-07-18 (×2): 650 mg via ORAL
  Filled 2016-07-17: qty 2

## 2016-07-17 MED ORDER — SODIUM CHLORIDE 0.9 % IV SOLN
INTRAVENOUS | Status: DC
  Administered 2016-07-17 – 2016-07-18 (×2): via INTRAVENOUS

## 2016-07-17 MED ORDER — ONDANSETRON HCL 4 MG PO TABS
4.0000 mg | ORAL_TABLET | Freq: Four times a day (QID) | ORAL | Status: DC | PRN
Start: 2016-07-17 — End: 2016-07-22

## 2016-07-17 MED ORDER — HEPARIN SODIUM (PORCINE) 5000 UNIT/ML IJ SOLN
5000.0000 [IU] | Freq: Three times a day (TID) | INTRAMUSCULAR | Status: DC
  Administered 2016-07-18 – 2016-07-22 (×14): 5000 [IU] via SUBCUTANEOUS
  Filled 2016-07-17 (×14): qty 1

## 2016-07-17 NOTE — ED Provider Notes (Signed)
[facility]PT Provider Note   CSN: [id number] Arrival date & time: 07/06/2016  1000  History   Chief Complaint Chief Complaint  Patient presents with  . Hypotension   HPI Jesse Mccarthy is a 75 y.o. male.  HPI  Presenting with three week history of low blood pressure, fatigue, worsening edema in bilateral lower extremities. Notes swelling is worse at night and improved in the morning. The swelling has made it difficult for him to walk. Denies chest pain. Reports occasional palpitations, but this has been occurring for many years and he notes no change. Notes some shortness of breath with exertion. Notes some orthopnea, but denies paroxysmal nocturnal dyspnea. Was evaluated by PCP and asked to come to ED for further evaluation.   Reports history of kidney problem, not on dialysis. Denies anti-hypertensives. Smokes 7 cigarettes per day.  Past Medical History:  Diagnosis Date  . BPH (benign prostatic hyperplasia)   . Gout    There are no active problems to display for this patient.   Past Surgical History:  Procedure Laterality Date  . CIRCUMCISION     as child in 4th grade  . TRANSURETHRAL RESECTION OF PROSTATE  03/24/2012   Procedure: TRANSURETHRAL RESECTION OF THE PROSTATE (TURP);  Surgeon: Ky BarbanMohammad I Javaid, MD;  Location: [facility];  Service: Urology;  Laterality: N/A;    Home Medications    Prior to Admission medications   Medication Sig Start Date End Date Taking? Authorizing Provider  allopurinol (ZYLOPRIM) 100 MG tablet Take 100 mg by mouth 2 (two) times daily.    Yes Historical Provider, MD  colchicine 0.6 MG tablet Take 0.6 mg by mouth daily.   Yes Historical Provider, MD  naproxen sodium (ANAPROX) 220 MG tablet Take 220 mg by mouth daily as needed (pain).   Yes Historical Provider, MD  omeprazole (PRILOSEC) 40 MG capsule Take 40 mg by mouth daily.   Yes Historical Provider, MD  tamsulosin (FLOMAX) 0.4 MG CAPS capsule Take 0.4 mg by mouth daily.   Yes Historical  Provider, MD   Family History No family history on file.  Social History Social History  Substance Use Topics  . Smoking status: Current Every Day Smoker    Packs/day: 0.50    Years: 15.00    Types: Cigarettes  . Smokeless tobacco: Never Used  . Alcohol use Yes     Comment: occassional    Allergies   Patient has no known allergies.  Review of Systems Review of Systems  Constitutional: Positive for fatigue. Negative for fever.  Respiratory: Positive for shortness of breath.   Cardiovascular: Positive for palpitations. Negative for chest pain.     Physical Exam Updated Vital Signs BP 98/79 (BP Location: Right Arm)   Pulse 98   Resp 18   Ht 5\' 9"  (1.753 m)   Wt 65.8 kg   SpO2 100%   BMI 21.41 kg/m   Physical Exam  Constitutional: He appears well-developed and well-nourished. No distress.  HENT:  Head: Normocephalic and atraumatic.  Mouth/Throat: Oropharynx is clear and moist.  Cardiovascular: Normal rate and regular rhythm.   Murmur heard. Pulmonary/Chest: Effort normal. No respiratory distress. He has no wheezes.  Abdominal: Soft. He exhibits no distension. There is no tenderness.  Musculoskeletal:  2+ pitting edema to mid calve in bilateral lower extremities  Skin: No rash noted.   ED Treatments / Results  Labs (all labs ordered are listed, but only abnormal results are displayed) Labs Reviewed  BASIC METABOLIC PANEL - Abnormal;  Notable for the following:       Result Value   Chloride 115 (*)    CO2 16 (*)    Glucose, Bld 139 (*)    BUN 36 (*)    Creatinine, Ser 2.94 (*)    GFR calc non Af Amer 19 (*)    GFR calc Af Amer 23 (*)    All other components within normal limits  TROPONIN I - Abnormal; Notable for the following:    Troponin I 0.22 (*)    All other components within normal limits  BRAIN NATRIURETIC PEPTIDE - Abnormal; Notable for the following:    B Natriuretic Peptide 1,213.0 (*)    All other components within normal limits  TROPONIN I      EKG  EKG Interpretation None       Date: 2015/09/30  Rate: 96  Rhythm: normal sinus rhythm  QRS Axis: normal  Intervals: normal  ST/T Wave abnormalities: questionable ST depression in V5 and V6  Conduction Disutrbances: none  Narrative Interpretation:   Radiology Dg Chest 2 View  Result Date: 2015/09/30 CLINICAL DATA:  Productive cough. EXAM: CHEST  2 VIEW COMPARISON:  None. FINDINGS: The heart size and mediastinal contours are within normal limits. No pneumothorax is noted. Mild bilateral pleural effusions are noted, left greater than right. Probable mild left basilar atelectasis or scarring is noted. The visualized skeletal structures are unremarkable. IMPRESSION: Mild bilateral pleural effusions, left greater than right. Probable mild left basilar atelectasis or scarring. Electronically Signed   By: Lupita RaiderJames  Green Jr, M.D.   On: 2015/09/30 12:25    Procedures Procedures (including critical care time)  Medications Ordered in ED Medications - No data to display   Initial Impression / Assessment and Plan / ED Course  I have reviewed the triage vital signs and the nursing notes.  Pertinent labs & imaging results that were available during my care of the patient were reviewed by me and considered in my medical decision making (see chart for details).  Clinical Course   - BMP with Creatinine 2.94, hyperglycemia to 139 - BNP elevated to 1,213 - Troponin elevated to 0.22 - CXR with mild bilateral pleural effusions, left greater than right.  - Discussed with Cardiology. Recommend admitting for further workup given multiple lab abnormalities--Troponin 0.22, BNP 1,213, and elevated creatinine. - Hospitalist consulted. [facility] and transition to Dr. Letitia NeriFanta's service tomorrow morning.  Final Clinical Impressions(s) / ED Diagnoses   Final diagnoses:  Idiopathic hypotension  Elevated troponin  [facility]. To transition to care of Dr.  Felecia ShellingFanta tomorrow morning.  New Prescriptions New Prescriptions   No medications on file     Araceli BoucheRaleigh N Laquida Cotrell, DO August 01, 2016 1432    Donnetta HutchingBrian Cook, MD 07/24/16 1500

## 2016-07-17 NOTE — ED Notes (Signed)
CRITICAL VALUE ALERT  Critical value received:  Troponin 0.22  Date of notification:  09-02-15  Time of notification  1204  Critical value read back: yes  Nurse who received alert: Agustin Creeobin K  MD notified (1st page): Dr Adriana Simasook

## 2016-07-17 NOTE — Progress Notes (Addendum)
Pt's BP 76/52 and HR 96.  Pt c/o dizziness at this time.  Dr. Conley RollsLe paged and made aware.  Will continue to monitor patient.

## 2016-07-17 NOTE — ED Notes (Signed)
[phone] Jesse Failwendy, daughter [phone] Jesse JohnBrian, grandson

## 2016-07-17 NOTE — ED Triage Notes (Signed)
Having issues with low blood pressure and weakness for 3 weeks.  Seen at Dr Letitia NeriFanta's office this am and told to come to ER.  Denies any pain.  C/o weakness.  C/o swelling in BLE.

## 2016-07-17 NOTE — H&P (Signed)
History and Physical    Jesse MarketRonald E Mccarthy ZOX:[id number] DOB: 11-Sep-1940 DOA: 01-10-16  Referring MD/NP/PA: Araceli Bouchealeigh N Rumley, DO PCP: Jesse GullyFANTA,TESFAYE, MD  Outpatient Specialists: None  Patient coming from: Home  Chief Complaint: Referred to the ED by Dr. Felecia ShellingFanta for low blood pressure and fatigue.  HPI: Jesse MarketRonald E Mccarthy is a 75 y.o. male with a medical history significant of  presents to the ED with complaints of low blood pressure and fatigue that onset about 3 weeks ago. He also has some swelling below his knee that appears to be worse than usualy. He states that he has been having difficulty walking since the swelling has increased. It is noted that he is on Anaprox and colchicine and allopurinol.  Denies CP. He was evaluated by his PCP this morning in which he was referred to come to the ED at that time. In the ED, all vital signs appear stable. BUN 36, creatinine 2.94, BNP 1,213, and troponin I  0.22. CXR shows mild bilateral pleural effusions, left greater than right. Probable mild left basilar atelectasis or scarring. Hospitalist was asked to admit patient for further evaluation.  Review of Systems: As per HPI otherwise 10 point review of systems negative.    Past Medical History:  Diagnosis Date  . BPH (benign prostatic hyperplasia)   . Gout     Past Surgical History:  Procedure Laterality Date  . CIRCUMCISION     as child in 4th grade  . TRANSURETHRAL RESECTION OF PROSTATE  03/24/2012   Procedure: TRANSURETHRAL RESECTION OF THE PROSTATE (TURP);  Surgeon: Ky BarbanMohammad I Javaid, MD;  Location: [facility];  Service: Urology;  Laterality: N/A;     reports that he has been smoking Cigarettes.  He has a 7.50 pack-year smoking history. He has never used smokeless tobacco. He reports that he drinks alcohol. He reports that he does not use drugs.  No Known Allergies  No family history on file.   Prior to Admission medications   Medication Sig Start Date End Date Taking? Authorizing Provider    allopurinol (ZYLOPRIM) 100 MG tablet Take 100 mg by mouth 2 (two) times daily.    Yes Historical Provider, MD  colchicine 0.6 MG tablet Take 0.6 mg by mouth daily.   Yes Historical Provider, MD  naproxen sodium (ANAPROX) 220 MG tablet Take 220 mg by mouth daily as needed (pain).   Yes Historical Provider, MD  omeprazole (PRILOSEC) 40 MG capsule Take 40 mg by mouth daily.   Yes Historical Provider, MD  tamsulosin (FLOMAX) 0.4 MG CAPS capsule Take 0.4 mg by mouth daily.   Yes Historical Provider, MD    Physical Exam: Vitals:   16-Sep-2015 1011 16-Sep-2015 1418  BP: (!) 83/64 98/79  Pulse: 101 98  Resp: (!) 28 18  SpO2: 99% 100%  Weight: 65.8 kg (145 lb)   Height: 5\' 9"  (1.753 m)       Constitutional: NAD, calm, comfortable Vitals:   16-Sep-2015 1011 16-Sep-2015 1418  BP: (!) 83/64 98/79  Pulse: 101 98  Resp: (!) 28 18  SpO2: 99% 100%  Weight: 65.8 kg (145 lb)   Height: 5\' 9"  (1.753 m)    Eyes: PERRL, lids and conjunctivae normal ENMT: Mucous membranes are moist. Posterior pharynx clear of any exudate or lesions.Normal dentition.  Neck: normal, supple, no masses, no thyromegaly Respiratory: clear to auscultation bilaterally, no wheezing, no crackles. Normal respiratory effort. No accessory muscle use.  Cardiovascular: Regular rate and rhythm, no murmurs / rubs / gallops. No  extremity edema. 2+ pedal pulses. No carotid bruits.  Abdomen: no tenderness, no masses palpated. No hepatosplenomegaly. Bowel sounds positive.  Musculoskeletal: no clubbing / cyanosis. No joint deformity upper and lower extremities. Good ROM, no contractures. Normal muscle tone.  Skin: no rashes, lesions, ulcers. No induration Neurologic: CN 2-12 grossly intact. Sensation intact, DTR normal. Strength 5/5 in all 4.  Psychiatric: Normal judgment and insight. Alert and oriented x 3. Normal mood.    Labs on Admission: I have personally reviewed following labs and imaging studies  Basic Metabolic Panel:  Recent  Labs Lab 07/19/2016 1136  NA 139  K 4.6  CL 115*  CO2 16*  GLUCOSE 139*  BUN 36*  CREATININE 2.94*  CALCIUM 9.1   GFR: Estimated Creatinine Clearance: 20.2 mL/min (by C-G formula based on SCr of 2.94 mg/dL (H)).  Cardiac Enzymes:  Recent Labs Lab 07/04/2016 1136  TROPONINI 0.22*    Radiological Exams on Admission: Dg Chest 2 View  Result Date: 07/16/2016 CLINICAL DATA:  Productive cough. EXAM: CHEST  2 VIEW COMPARISON:  None. FINDINGS: The heart size and mediastinal contours are within normal limits. No pneumothorax is noted. Mild bilateral pleural effusions are noted, left greater than right. Probable mild left basilar atelectasis or scarring is noted. The visualized skeletal structures are unremarkable. IMPRESSION: Mild bilateral pleural effusions, left greater than right. Probable mild left basilar atelectasis or scarring. Electronically Signed   By: Lupita RaiderJames  Green Jr, M.D.   On: 07/23/2016 12:25     Assessment/Plan Principal Problem:   AKI (acute kidney injury) (HCC) Active Problems:   Gout   BPH (benign prostatic hyperplasia)   Bilateral leg edema   Volume depletion   1. AKI:  I suspect he is volume depletion, aggravated by nephrotoxic medication.  He could have nephrotic syndrome or hyoalbuminemia causing lower extremity swelling.  Will need full work up to include renal US, ECHO, 24 hours urine for protein, and UA.  Will hold Naproxen, allopurinol, and colchicine.  Give IVF challenge, and consult nephrology. Monitor input and output. 2. Bilateral leg edema. This appears to be a chronic problem. He states that his legs are typically swollen but it is more swollen at this time. Continue to monitor his intake and output. 3. BPH. Noted 4. Gout. Will hold colchicine and allopurinol in the setting of AKI.  If allopurinol is resume, perhaps 100mg  per day would be adequate.    DVT prophylaxis: Heparin Code Status: Full  Family Communication: No family at bedside  Disposition  Plan: Discharge once improved Consults called: None Admission status: Admit to inpatient   Houston SirenPeter Aya Geisel, MD   FACP [facility] If 7PM-7AM, please contact night-coverage [email or web address] Password [email or web address]  06/29/2016, 2:21 PM   By signing my name below, I, Bobbie Stackhristopher Reid, attest that this documentation has been prepared under the direction and in the presence of Houston SirenPeter Marjorie Deprey, MD. Electronically signed: Bobbie Stackhristopher Reid, Scribe.  06/26/2016, 2:43 PM

## 2016-07-17 NOTE — ED Notes (Signed)
Floor unable to take report.

## 2016-07-18 ENCOUNTER — Inpatient Hospital Stay (HOSPITAL_COMMUNITY): Payer: Commercial Managed Care - HMO

## 2016-07-18 DIAGNOSIS — R0600 Dyspnea, unspecified: Secondary | ICD-10-CM

## 2016-07-18 DIAGNOSIS — N320 Bladder-neck obstruction: Secondary | ICD-10-CM

## 2016-07-18 DIAGNOSIS — N179 Acute kidney failure, unspecified: Secondary | ICD-10-CM

## 2016-07-18 DIAGNOSIS — R338 Other retention of urine: Secondary | ICD-10-CM

## 2016-07-18 LAB — COMPREHENSIVE METABOLIC PANEL
ALBUMIN: 3 g/dL — AB (ref 3.5–5.0)
ALT: 23 U/L (ref 17–63)
ANION GAP: 7 (ref 5–15)
AST: 20 U/L (ref 15–41)
Alkaline Phosphatase: 136 U/L — ABNORMAL HIGH (ref 38–126)
BUN: 38 mg/dL — ABNORMAL HIGH (ref 6–20)
CHLORIDE: 119 mmol/L — AB (ref 101–111)
CO2: 16 mmol/L — AB (ref 22–32)
Calcium: 8.8 mg/dL — ABNORMAL LOW (ref 8.9–10.3)
Creatinine, Ser: 3.23 mg/dL — ABNORMAL HIGH (ref 0.61–1.24)
GFR calc Af Amer: 20 mL/min — ABNORMAL LOW (ref >60)
GFR calc non Af Amer: 17 mL/min — ABNORMAL LOW (ref >60)
GLUCOSE: 108 mg/dL — AB (ref 65–99)
POTASSIUM: 6 mmol/L — AB (ref 3.5–5.1)
Sodium: 142 mmol/L (ref 135–145)
Total Bilirubin: 0.7 mg/dL (ref 0.3–1.2)
Total Protein: 5.7 g/dL — ABNORMAL LOW (ref 6.5–8.1)

## 2016-07-18 LAB — TROPONIN I
TROPONIN I: 0.19 ng/mL — AB (ref <0.03)
TROPONIN I: 0.16 ng/mL — AB (ref <0.03)

## 2016-07-18 LAB — ECHOCARDIOGRAM COMPLETE
HEIGHTINCHES: 69 in
WEIGHTICAEL: 2182.4 [oz_av]

## 2016-07-18 MED ORDER — TRAMADOL HCL 50 MG PO TABS
50.0000 mg | ORAL_TABLET | Freq: Three times a day (TID) | ORAL | Status: DC | PRN
  Administered 2016-07-18: 50 mg via ORAL
  Filled 2016-07-18: qty 1

## 2016-07-18 MED ORDER — INSULIN ASPART 100 UNIT/ML IV SOLN
10.0000 [IU] | Freq: Once | INTRAVENOUS | Status: AC
  Administered 2016-07-18: 10 [IU] via INTRAVENOUS

## 2016-07-18 MED ORDER — DEXTROSE 50 % IV SOLN
1.0000 | Freq: Once | INTRAVENOUS | Status: AC
  Administered 2016-07-18: 50 mL via INTRAVENOUS

## 2016-07-18 MED ORDER — DEXTROSE 50 % IV SOLN
INTRAVENOUS | Status: AC
  Filled 2016-07-18: qty 50

## 2016-07-18 MED ORDER — SODIUM POLYSTYRENE SULFONATE 15 GM/60ML PO SUSP
30.0000 g | ORAL | Status: AC
Start: 2016-07-18 — End: 2016-07-18
  Administered 2016-07-18 (×2): 30 g via ORAL
  Filled 2016-07-18 (×2): qty 120

## 2016-07-18 MED ORDER — SODIUM CHLORIDE 0.9 % IV SOLN
1.0000 g | Freq: Once | INTRAVENOUS | Status: AC
  Administered 2016-07-18: 1 g via INTRAVENOUS
  Filled 2016-07-18: qty 10

## 2016-07-18 MED ORDER — SODIUM CHLORIDE 0.9 % IV SOLN: 1.0000 mg/kg/h | Freq: Once | INTRAVENOUS | Status: DC

## 2016-07-18 MED ORDER — STERILE WATER FOR INJECTION IV SOLN
INTRAVENOUS | Status: DC
  Administered 2016-07-18 – 2016-07-19 (×3): via INTRAVENOUS
  Filled 2016-07-18 (×13): qty 9.71

## 2016-07-18 NOTE — Progress Notes (Signed)
  Echocardiogram 2D Echocardiogram has been performed.  Nolon RodBrown, Tony 07/18/2016, 10:56 AM

## 2016-07-18 NOTE — Consult Note (Signed)
Urology Consult  Referring physician: Truett Mainland Reason for referral: Urine retention  Chief Complaint: Urine retention  History of Present Illness: Elderly male unable to pass catheter; low bladder volume; admitted with acute renal injury; hs BPH; elevated serum K; previous TURP; flow reasonable at home; nocturia x1; no UTI  Does not feel distended  Modifying factors: There are no other modifying factors  Associated signs and symptoms: There are no other associated signs and symptoms Aggravating and relieving factors: There are no other aggravating or relieving factors Severity: Mild Duration: Persistent  Past Medical History:  Diagnosis Date  . BPH (benign prostatic hyperplasia)   . Chronic kidney disease   . Gout    Past Surgical History:  Procedure Laterality Date  . CIRCUMCISION     as child in 4th grade  . TRANSURETHRAL RESECTION OF PROSTATE  03/24/2012   Procedure: TRANSURETHRAL RESECTION OF THE PROSTATE (TURP);  Surgeon: Marissa Nestle, MD;  Location: [facility];  Service: Urology;  Laterality: N/A;    Medications: I have reviewed the patient's current medications. Allergies: No Known Allergies  History reviewed. No pertinent family history. Social History:  reports that he has been smoking Cigarettes.  He has a 3.75 pack-year smoking history. He has never used smokeless tobacco. He reports that he does not drink alcohol or use drugs.  ROS: All systems are reviewed and negative except as noted. Rest negative  Physical Exam:  Vital signs in last 24 hours: Temp:  [97.6 F (36.4 C)-98.2 F (36.8 C)] 97.6 F (36.4 C) (11/23 0510) Pulse Rate:  [38-94] 38 (11/23 1328) Resp:  [15-25] 16 (11/23 1328) BP: (75-101)/(50-78) 88/56 (11/23 1328) SpO2:  [93 %-100 %] 98 % (11/23 1328) Weight:  [61.9 kg (136 lb 6.4 oz)-62.2 kg (137 lb 1.6 oz)] 61.9 kg (136 lb 6.4 oz) (11/23 0510)  Cardiovascular: Skin warm; not flushed Respiratory: Breaths quiet; no shortness of breath Abdomen:  No masses Neurological: Normal sensation to touch Musculoskeletal: Normal motor function arms and legs Lymphatics: No inguinal adenopathy Skin: No rashes Genitourinary:non distended; male genitalia normal  Laboratory Data:  Results for orders placed or performed during the hospital encounter of 07/05/2016 (from the past 72 hour(s))  Basic metabolic panel     Status: Abnormal   Collection Time: 07/12/2016 11:36 AM  Result Value Ref Range   Sodium 139 135 - 145 mmol/L   Potassium 4.6 3.5 - 5.1 mmol/L   Chloride 115 (H) 101 - 111 mmol/L   CO2 16 (L) 22 - 32 mmol/L   Glucose, Bld 139 (H) 65 - 99 mg/dL   BUN 36 (H) 6 - 20 mg/dL   Creatinine, Ser 2.94 (H) 0.61 - 1.24 mg/dL   Calcium 9.1 8.9 - 10.3 mg/dL   GFR calc non Af Amer 19 (L) >60 mL/min   GFR calc Af Amer 23 (L) >60 mL/min    Comment: (NOTE) The eGFR has been calculated using the CKD EPI equation. This calculation has not been validated in all clinical situations. eGFR's persistently <60 mL/min signify possible Chronic Kidney Disease.    Anion gap 8 5 - 15  Troponin I     Status: Abnormal   Collection Time: 07/12/2016 11:36 AM  Result Value Ref Range   Troponin I 0.22 (HH) <0.03 ng/mL    Comment: CRITICAL RESULT CALLED TO, READ BACK BY AND VERIFIED WITH: KNOWLES,R AT 12:05PM ON 07/23/2016 BY [facility]   Brain natriuretic peptide     Status: Abnormal   Collection Time: 07/24/2016  11:36 AM  Result Value Ref Range   B Natriuretic Peptide 1,213.0 (H) 0.0 - 100.0 pg/mL  TSH     Status: None   Collection Time: 07/05/2016 11:36 AM  Result Value Ref Range   TSH 4.258 0.350 - 4.500 uIU/mL    Comment: Performed by a 3rd Generation assay with a functional sensitivity of <=0.01 uIU/mL.  Troponin I     Status: Abnormal   Collection Time: 07/03/2016  2:02 PM  Result Value Ref Range   Troponin I 0.19 (HH) <0.03 ng/mL    Comment: CRITICAL VALUE NOTED.  VALUE IS CONSISTENT WITH PREVIOUSLY REPORTED AND CALLED VALUE.  Calcium     Status: None    Collection Time: 07/02/2016  6:28 PM  Result Value Ref Range   Calcium 8.9 8.9 - 10.3 mg/dL  Magnesium     Status: None   Collection Time: 07/16/2016  6:28 PM  Result Value Ref Range   Magnesium 1.8 1.7 - 2.4 mg/dL  Phosphorus     Status: None   Collection Time: 07/14/2016  6:28 PM  Result Value Ref Range   Phosphorus 3.8 2.5 - 4.6 mg/dL  Troponin I     Status: Abnormal   Collection Time: 07/16/2016  6:28 PM  Result Value Ref Range   Troponin I 0.21 (HH) <0.03 ng/mL    Comment: CRITICAL VALUE NOTED.  VALUE IS CONSISTENT WITH PREVIOUSLY REPORTED AND CALLED VALUE.  Troponin I     Status: Abnormal   Collection Time: 07/06/2016 11:16 PM  Result Value Ref Range   Troponin I 0.19 (HH) <0.03 ng/mL    Comment: CRITICAL VALUE NOTED.  VALUE IS CONSISTENT WITH PREVIOUSLY REPORTED AND CALLED VALUE.  Comprehensive metabolic panel     Status: Abnormal   Collection Time: 07/18/16  6:28 AM  Result Value Ref Range   Sodium 142 135 - 145 mmol/L   Potassium 6.0 (H) 3.5 - 5.1 mmol/L    Comment: DELTA CHECK NOTED   Chloride 119 (H) 101 - 111 mmol/L   CO2 16 (L) 22 - 32 mmol/L   Glucose, Bld 108 (H) 65 - 99 mg/dL   BUN 38 (H) 6 - 20 mg/dL   Creatinine, Ser 3.23 (H) 0.61 - 1.24 mg/dL   Calcium 8.8 (L) 8.9 - 10.3 mg/dL   Total Protein 5.7 (L) 6.5 - 8.1 g/dL   Albumin 3.0 (L) 3.5 - 5.0 g/dL   AST 20 15 - 41 U/L   ALT 23 17 - 63 U/L   Alkaline Phosphatase 136 (H) 38 - 126 U/L   Total Bilirubin 0.7 0.3 - 1.2 mg/dL   GFR calc non Af Amer 17 (L) >60 mL/min   GFR calc Af Amer 20 (L) >60 mL/min    Comment: (NOTE) The eGFR has been calculated using the CKD EPI equation. This calculation has not been validated in all clinical situations. eGFR's persistently <60 mL/min signify possible Chronic Kidney Disease.    Anion gap 7 5 - 15  Troponin I     Status: Abnormal   Collection Time: 07/18/16  6:28 AM  Result Value Ref Range   Troponin I 0.16 (HH) <0.03 ng/mL    Comment: CRITICAL VALUE NOTED.  VALUE IS  CONSISTENT WITH PREVIOUSLY REPORTED AND CALLED VALUE.   No results found for this or any previous visit (from the past 240 hour(s)). 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

## 2016-07-18 NOTE — Consult Note (Signed)
Reason for Consult: Acute on chronic renal failure Referring Physician: Dr. [name] is an 75 y.o. male.  HPI: He is a patient with history of BPH, status post TURP, history of gout presently came with complaints of weakness, hypotension and feeling tired. Patient also stated that he has swelling of the legs and abdominal distention. He denies any nausea or vomiting. His appetite is poor. When patient was evaluated and was found to have significantly elevated BUN and creatinine hence consult is called. Presently patient is feeling somewhat better.  Past Medical History:  Diagnosis Date  . BPH (benign prostatic hyperplasia)   . Chronic kidney disease   . Gout     Past Surgical History:  Procedure Laterality Date  . CIRCUMCISION     as child in 4th grade  . TRANSURETHRAL RESECTION OF PROSTATE  03/24/2012   Procedure: TRANSURETHRAL RESECTION OF THE PROSTATE (TURP);  Surgeon: Marissa Nestle, MD;  Location: [facility];  Service: Urology;  Laterality: N/A;    History reviewed. No pertinent family history.  Social History:  reports that he has been smoking Cigarettes.  He has a 3.75 pack-year smoking history. He has never used smokeless tobacco. He reports that he does not drink alcohol or use drugs.  Allergies: No Known Allergies  Medications: I have reviewed the patient's current medications.  Results for orders placed or performed during the hospital encounter of 07/16/2016 (from the past 48 hour(s))  Basic metabolic panel     Status: Abnormal   Collection Time: 07/01/2016 11:36 AM  Result Value Ref Range   Sodium 139 135 - 145 mmol/L   Potassium 4.6 3.5 - 5.1 mmol/L   Chloride 115 (H) 101 - 111 mmol/L   CO2 16 (L) 22 - 32 mmol/L   Glucose, Bld 139 (H) 65 - 99 mg/dL   BUN 36 (H) 6 - 20 mg/dL   Creatinine, Ser 2.94 (H) 0.61 - 1.24 mg/dL   Calcium 9.1 8.9 - 10.3 mg/dL   GFR calc non Af Amer 19 (L) >60 mL/min   GFR calc Af Amer 23 (L) >60 mL/min    Comment: (NOTE) The eGFR  has been calculated using the CKD EPI equation. This calculation has not been validated in all clinical situations. eGFR's persistently <60 mL/min signify possible Chronic Kidney Disease.    Anion gap 8 5 - 15  Troponin I     Status: Abnormal   Collection Time: 07/23/2016 11:36 AM  Result Value Ref Range   Troponin I 0.22 (HH) <0.03 ng/mL    Comment: CRITICAL RESULT CALLED TO, READ BACK BY AND VERIFIED WITH: KNOWLES,R AT 12:05PM ON 07/15/2016 BY FESTERMAN,C   Brain natriuretic peptide     Status: Abnormal   Collection Time: 07/13/2016 11:36 AM  Result Value Ref Range   B Natriuretic Peptide 1,213.0 (H) 0.0 - 100.0 pg/mL  TSH     Status: None   Collection Time: 07/12/2016 11:36 AM  Result Value Ref Range   TSH 4.258 0.350 - 4.500 uIU/mL    Comment: Performed by a 3rd Generation assay with a functional sensitivity of <=0.01 uIU/mL.  Troponin I     Status: Abnormal   Collection Time: 07/09/2016  2:02 PM  Result Value Ref Range   Troponin I 0.19 (HH) <0.03 ng/mL    Comment: CRITICAL VALUE NOTED.  VALUE IS CONSISTENT WITH PREVIOUSLY REPORTED AND CALLED VALUE.  Calcium     Status: None   Collection Time: 07/16/2016  6:28 PM  Result Value Ref Range   Calcium 8.9 8.9 - 10.3 mg/dL  Magnesium     Status: None   Collection Time: 07/01/2016  6:28 PM  Result Value Ref Range   Magnesium 1.8 1.7 - 2.4 mg/dL  Phosphorus     Status: None   Collection Time: 07/07/2016  6:28 PM  Result Value Ref Range   Phosphorus 3.8 2.5 - 4.6 mg/dL  Troponin I     Status: Abnormal   Collection Time: 07/18/2016  6:28 PM  Result Value Ref Range   Troponin I 0.21 (HH) <0.03 ng/mL    Comment: CRITICAL VALUE NOTED.  VALUE IS CONSISTENT WITH PREVIOUSLY REPORTED AND CALLED VALUE.  Troponin I     Status: Abnormal   Collection Time: 07/03/2016 11:16 PM  Result Value Ref Range   Troponin I 0.19 (HH) <0.03 ng/mL    Comment: CRITICAL VALUE NOTED.  VALUE IS CONSISTENT WITH PREVIOUSLY REPORTED AND CALLED VALUE.  Comprehensive  metabolic panel     Status: Abnormal   Collection Time: 07/18/16  6:28 AM  Result Value Ref Range   Sodium 142 135 - 145 mmol/L   Potassium 6.0 (H) 3.5 - 5.1 mmol/L    Comment: DELTA CHECK NOTED   Chloride 119 (H) 101 - 111 mmol/L   CO2 16 (L) 22 - 32 mmol/L   Glucose, Bld 108 (H) 65 - 99 mg/dL   BUN 38 (H) 6 - 20 mg/dL   Creatinine, Ser 3.23 (H) 0.61 - 1.24 mg/dL   Calcium 8.8 (L) 8.9 - 10.3 mg/dL   Total Protein 5.7 (L) 6.5 - 8.1 g/dL   Albumin 3.0 (L) 3.5 - 5.0 g/dL   AST 20 15 - 41 U/L   ALT 23 17 - 63 U/L   Alkaline Phosphatase 136 (H) 38 - 126 U/L   Total Bilirubin 0.7 0.3 - 1.2 mg/dL   GFR calc non Af Amer 17 (L) >60 mL/min   GFR calc Af Amer 20 (L) >60 mL/min    Comment: (NOTE) The eGFR has been calculated using the CKD EPI equation. This calculation has not been validated in all clinical situations. eGFR's persistently <60 mL/min signify possible Chronic Kidney Disease.    Anion gap 7 5 - 15  Troponin I     Status: Abnormal   Collection Time: 07/18/16  6:28 AM  Result Value Ref Range   Troponin I 0.16 (HH) <0.03 ng/mL    Comment: CRITICAL VALUE NOTED.  VALUE IS CONSISTENT WITH PREVIOUSLY REPORTED AND CALLED VALUE.    Dg Chest 2 View  Result Date: 07/21/2016 CLINICAL DATA:  Productive cough. EXAM: CHEST  2 VIEW COMPARISON:  None. FINDINGS: The heart size and mediastinal contours are within normal limits. No pneumothorax is noted. Mild bilateral pleural effusions are noted, left greater than right. Probable mild left basilar atelectasis or scarring is noted. The visualized skeletal structures are unremarkable. IMPRESSION: Mild bilateral pleural effusions, left greater than right. Probable mild left basilar atelectasis or scarring. Electronically Signed   By: Marijo Conception, M.D.   On: 07/01/2016 12:25    Review of Systems  Constitutional: Negative for chills and fever.  HENT: Positive for congestion.   Respiratory: Negative for cough, hemoptysis and sputum  production.   Cardiovascular: Positive for leg swelling. Negative for chest pain.  Gastrointestinal: Negative for abdominal pain, nausea and vomiting.   Blood pressure (!) 75/50, pulse 92, temperature 97.6 F (36.4 C), temperature source Oral, resp. rate 15, height _0  (1.753 m),  weight 61.9 kg (136 lb 6.4 oz), SpO2 100 %. Physical Exam  Constitutional: He is oriented to person, place, and time. No distress.  ematiated  Eyes: No scleral icterus.  Neck: No JVD present.  Cardiovascular: Normal rate and regular rhythm.   Respiratory: No respiratory distress. He has no wheezes.  GI: He exhibits distension. There is no tenderness. There is no rebound.  Musculoskeletal: He exhibits edema.  Neurological: He is alert and oriented to person, place, and time.    Assessment/Plan: Problem #1 acute kidney injury: Possibly prerenal/ATN/NSAIDS. Presently obstructive uropathy cannot be ruled out. Patient at this moment is oliguric. His potassium is normal. Problem #2 chronic renal failure. His creatinine was 1.28 on 03/18/16. His GFR was 63 hence stage II. This could be secondary to obstructive uropathy/ischemic. Problem #3 history of gout Problem #4 history of BPH status post TURP Problem #5 history of pleural effusion Problem #6 hypertension: Patient blood pressure is low etiology not clear. Problem #7 history of leg swelling: Patient with hypoalbuminemia, leg edema. Presently no shortness whether patient has proteinuria. If he has significant proteinuria this could be consistent with nephrotic syndrome. Problem #8 hyperkalemia Plan: We'll put a Foley catheter  We'll do ultrasound of the kidneys We'll check UA We'll check ANA, complement, hepatitis C surface antigen, hepatitis C antibody We'll use the 50/insulin/calcium gluconate We'll give him keyalate 30 g by mouth now and repeat after 4 hours.  Jesse Mccarthy S 07/18/2016, 9:26 AM

## 2016-07-18 NOTE — Progress Notes (Signed)
Pt. Currently does not have a catheter due to two failed attempts by two different nurses. Different catheter for someone who has an enlarged prostate was obtained, yet no one qualified to insert it on the floor or in the ICU. MD notified, Urology has been consulted. Awaiting a response.

## 2016-07-18 NOTE — Progress Notes (Signed)
Bladder scan performed on pt. Because he hasn't urinated in over 12 hours. Largest volume gathered was 190 ml. MD notified.

## 2016-07-18 NOTE — Progress Notes (Signed)
Patient admitted with acute renal failure presumably due to multifactorial issues intravascular 5 depletion nephrotoxic medicines underlying illness BPH with possible outflow obstruction currently patient has potassium of 6.0 Kayexalate ordered seen by nephrology already give 30 g every 4 hours 2 doses be met will be monitored daily he likewise has hypoproteinemia consideration given to nephrotic syndrome autoimmune workup in progress Jesse Mccarthy TIW:[id number] DOB: 1941-03-06 DOA: 07/06/2016 PCP: Jesse Fire, MD   Physical Exam: Blood pressure (!) 75/50, pulse 92, temperature 97.6 F (36.4 C), temperature source Oral, resp. rate 15, height _0  (1.753 m), weight 61.9 kg (136 lb 6.4 oz), SpO2 100 %. Lungs diminished breath sounds in the bases prolonged expiratory phase scattered rhonchi no rales no wheezes appreciable heart regular rhythm no S3-4 no heaves thrills rubs abdomen soft nontender bowel sounds normoactive no edema 1-2+ bilaterally   Investigations:  No results found for this or any previous visit (from the past 240 hour(s)).   Basic Metabolic Panel:  Recent Labs  07/21/2016 1136 07/16/2016 1828 07/18/16 0628  NA 139  --  142  K 4.6  --  6.0*  CL 115*  --  119*  CO2 16*  --  16*  GLUCOSE 139*  --  108*  BUN 36*  --  38*  CREATININE 2.94*  --  3.23*  CALCIUM 9.1 8.9 8.8*  MG  --  1.8  --   PHOS  --  3.8  --    Liver Function Tests:  Recent Labs  07/18/16 0628  AST 20  ALT 23  ALKPHOS 136*  BILITOT 0.7  PROT 5.7*  ALBUMIN 3.0*     CBC: No results for input(s): WBC, NEUTROABS, HGB, HCT, MCV, PLT in the last 72 hours.  Dg Chest 2 View  Result Date: 07/09/2016 CLINICAL DATA:  Productive cough. EXAM: CHEST  2 VIEW COMPARISON:  None. FINDINGS: The heart size and mediastinal contours are within normal limits. No pneumothorax is noted. Mild bilateral pleural effusions are noted, left greater than right. Probable mild left basilar atelectasis or scarring is noted.  The visualized skeletal structures are unremarkable. IMPRESSION: Mild bilateral pleural effusions, left greater than right. Probable mild left basilar atelectasis or scarring. Electronically Signed   By: Marijo Conception, M.D.   On: 07/01/2016 12:25      Medications  Impression:  Principal Problem:   AKI (acute kidney injury) ([place]) Active Problems:   Gout   BPH (benign prostatic hyperplasia)   Bilateral leg edema   Volume depletion     Plan:Insert Foley catheter for possible bladder L at obstruction. Kayexalate ordered 30 g 2 doses monitor renal function daily fluid resuscitation for prerenal component nephrotoxic medicines already discontinued  Consultants: Dr. Raynelle Mccarthy and nephrology   Procedures renal ultrasound ordered   Antibiotics:           Time spent 30 minutes   LOS: 1 day   Jesse Mccarthy M   07/18/2016, 10:41 AM

## 2016-07-18 NOTE — Progress Notes (Signed)
Pt. Still unable to void. Urology was consulted and suggested waiting to see if the pt. Bladder fills up anymore and see if he can go on his own without having to do more invasive procedures. Will continue to monitor.

## 2016-07-19 DIAGNOSIS — N320 Bladder-neck obstruction: Secondary | ICD-10-CM

## 2016-07-19 DIAGNOSIS — N179 Acute kidney failure, unspecified: Secondary | ICD-10-CM

## 2016-07-19 DIAGNOSIS — R338 Other retention of urine: Secondary | ICD-10-CM

## 2016-07-19 LAB — RENAL FUNCTION PANEL
Albumin: 3 g/dL — ABNORMAL LOW (ref 3.5–5.0)
Anion gap: 11 (ref 5–15)
BUN: 37 mg/dL — AB (ref 6–20)
CHLORIDE: 116 mmol/L — AB (ref 101–111)
CO2: 14 mmol/L — AB (ref 22–32)
Calcium: 8.8 mg/dL — ABNORMAL LOW (ref 8.9–10.3)
Creatinine, Ser: 3.28 mg/dL — ABNORMAL HIGH (ref 0.61–1.24)
GFR calc Af Amer: 20 mL/min — ABNORMAL LOW (ref >60)
GFR, EST NON AFRICAN AMERICAN: 17 mL/min — AB (ref >60)
GLUCOSE: 86 mg/dL (ref 65–99)
POTASSIUM: 4.8 mmol/L (ref 3.5–5.1)
Phosphorus: 4.7 mg/dL — ABNORMAL HIGH (ref 2.5–4.6)
Sodium: 141 mmol/L (ref 135–145)

## 2016-07-19 LAB — RPR: RPR Ser Ql: NONREACTIVE

## 2016-07-19 LAB — C3 COMPLEMENT: C3 COMPLEMENT: 81 mg/dL — AB (ref 82–167)

## 2016-07-19 LAB — HEPATITIS B SURFACE ANTIGEN: HEP B S AG: NEGATIVE

## 2016-07-19 LAB — HEPATITIS C ANTIBODY

## 2016-07-19 LAB — C4 COMPLEMENT: COMPLEMENT C4, BODY FLUID: 14 mg/dL (ref 14–44)

## 2016-07-19 MED ORDER — TAMSULOSIN HCL 0.4 MG PO CAPS
0.8000 mg | ORAL_CAPSULE | Freq: Every day | ORAL | Status: DC
  Administered 2016-07-20 – 2016-07-21 (×2): 0.8 mg via ORAL
  Filled 2016-07-19 (×2): qty 2

## 2016-07-19 MED ORDER — SODIUM CHLORIDE 4 MEQ/ML IV SOLN
INTRAVENOUS | Status: DC
  Administered 2016-07-19 – 2016-07-20 (×4): via INTRAVENOUS
  Filled 2016-07-19 (×8): qty 9.71

## 2016-07-19 NOTE — Progress Notes (Signed)
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

## 2016-07-19 NOTE — Care Management Important Message (Signed)
Important Message  Patient Details  Name: Jesse Mccarthy MRN: [id number] Date of Birth: 05-23-1941   Medicare Important Message Given:  Yes    Willena Jeancharles, Chrystine OilerSharley Diane, RN 07/19/2016, 2:36 PM

## 2016-07-19 NOTE — Progress Notes (Signed)
Presumed acute kidney injury with hypoproteinemia hypoalbuminemia pedal edema. All NSAIDs have been removed patient seen by urology for attempted Foley insertion which was unsuccessful however residual volume and bladder was only 200 mL patient is producing urine and urology did not feel that the patient was obstructed I have increased Flomax 0.8 mg daily from 0.4. Renal ultrasound reveals questionable existence of left kidney Jesse MarketRonald E Mccarthy [id number] DOB: July 20, 1941 DOA: 06/27/2016 PCP: Avon GullyFANTA,TESFAYE, MD   Physical Exam: Blood pressure (!) 74/53, pulse 100, temperature 97.8 F (36.6 C), temperature source Oral, resp. rate 16, height 5\' 9"  (1.753 m), weight 63.9 kg (140 lb 14.4 oz), SpO2 98 %. Lungs clear to A&P no rales wheeze rhonchi heart regular rhythm no S3-S4 no heaves thrills rubs abdomen soft nontender bowel sounds normoactive no guarding no rebound   Investigations:  No results found for this or any previous visit (from the past 240 hour(s)).   Basic Metabolic Panel:  Recent Labs  81/19/1411/10/2015 1828 07/18/16 0628 07/19/16 0615  NA  --  142 141  K  --  6.0* 4.8  CL  --  119* 116*  CO2  --  16* 14*  GLUCOSE  --  108* 86  BUN  --  38* 37*  CREATININE  --  3.23* 3.28*  CALCIUM 8.9 8.8* 8.8*  MG 1.8  --   --   PHOS 3.8  --  4.7*   Liver Function Tests:  Recent Labs  07/18/16 0628 07/19/16 0615  AST 20  --   ALT 23  --   ALKPHOS 136*  --   BILITOT 0.7  --   PROT 5.7*  --   ALBUMIN 3.0* 3.0*     CBC: No results for input(s): WBC, NEUTROABS, HGB, HCT, MCV, PLT in the last 72 hours.  Dg Chest 2 View  Result Date: 07/13/2016 CLINICAL DATA:  Productive cough. EXAM: CHEST  2 VIEW COMPARISON:  None. FINDINGS: The heart size and mediastinal contours are within normal limits. No pneumothorax is noted. Mild bilateral pleural effusions are noted, left greater than right. Probable mild left basilar atelectasis or scarring is noted. The visualized skeletal structures are  unremarkable. IMPRESSION: Mild bilateral pleural effusions, left greater than right. Probable mild left basilar atelectasis or scarring. Electronically Signed   By: Lupita RaiderJames  Green Jr, M.D.   On: 07/17/2016 12:25   [place] Renal  Result Date: 07/18/2016 CLINICAL DATA:  acute renal injury EXAM: RENAL / URINARY TRACT ULTRASOUND COMPLETE COMPARISON:  Ultrasound 01/04/1959 FINDINGS: Right Kidney: Length: 11.7 cm. Echogenicity within normal limits. No mass or hydronephrosis visualized. Do case collecting system Left Kidney: Length: 8.2 cm. Cortical thinning with increased echogenicity. Previously LEFT kidney not identified. Bladder: Mild bladder wall thickening. Small amount of debris within the bladder. IMPRESSION: 1. No RIGHT hydronephrosis.  Duplicated collecting system 2. Questionable LEFT kidney identified. Previous ultrasounds did not identified LEFT kidney 3. Thick-walled bladder or debris. Correlate clinically for cystitis. Electronically Signed   By: Genevive BiStewart  Edmunds M.D.   On: 07/18/2016 12:02   Dg Foot Complete Right  Result Date: 07/18/2016 CLINICAL DATA:  Top of right foot pain. EXAM: RIGHT FOOT COMPLETE - 3+ VIEW COMPARISON:  None. FINDINGS: No acute fracture or dislocation. Juxta-articular erosions around the first MTP joint which is affected by advanced hallux valgus. Suspect previous resection of the second middle phalanx where there is bone loss with chronic marginated appearance. Marked osteopenia. IMPRESSION: 1. No acute osseous finding. 2. Gouty changes and hallux valgus. Electronically Signed  By: Marnee SpringJonathon  Watts M.D.   On: 07/18/2016 15:52      Medications:  Impression:  Principal Problem:   AKI (acute kidney injury) ([facility]) Active Problems:   Gout   BPH (benign prostatic hyperplasia)   Bilateral leg edema   Volume depletion     Plan: Monitor renal function daily continue fluid resuscitation for renal perfusion. Increased Flomax from 0.4 mg to 0.8 mg daily monitor urine output    Consultants: Nephrology and urology   Procedures   Antibiotics:           Time spent: 30 minutes   LOS: 2 days   Tawan Degroote M   07/19/2016, 11:05 AM

## 2016-07-19 NOTE — Progress Notes (Signed)
Subjective: Interval History: has no complaint of nausea or vomiting. His appetite is good. Patient denies any difficulty breathing..  Objective: Vital signs in last 24 hours: Temp:  [97.8 F (36.6 C)-97.9 F (36.6 C)] 97.8 F (36.6 C) (11/24 0544) Pulse Rate:  [38-102] 100 (11/24 0544) Resp:  [16] 16 (11/24 0544) BP: (74-91)/(53-59) 74/53 (11/24 0544) SpO2:  [97 %-98 %] 98 % (11/24 0544) Weight:  [63.9 kg (140 lb 14.4 oz)] 63.9 kg (140 lb 14.4 oz) (11/24 0544) Weight change: -1.86 kg (-4 lb 1.6 oz)  Intake/Output from previous day: 11/23 0701 - 11/24 0700 In: 3787.5 [P.O.:720; I.V.:3067.5] Out: -  Intake/Output this shift: No intake/output data recorded.  General appearance: alert, cooperative and no distress Resp: clear to auscultation bilaterally Cardio: regular rate and rhythm Extremities: edema He has 1+ edema  Lab Results: No results for input(s): WBC, HGB, HCT, PLT in the last 72 hours. BMET:  Recent Labs  07/18/16 0628 07/19/16 0615  NA 142 141  K 6.0* 4.8  CL 119* 116*  CO2 16* 14*  GLUCOSE 108* 86  BUN 38* 37*  CREATININE 3.23* 3.28*  CALCIUM 8.8* 8.8*   No results for input(s): PTH in the last 72 hours. Iron Studies: No results for input(s): IRON, TIBC, TRANSFERRIN, FERRITIN in the last 72 hours.  Studies/Results: Dg Chest 2 View  Result Date: 12/31/15 CLINICAL DATA:  Productive cough. EXAM: CHEST  2 VIEW COMPARISON:  None. FINDINGS: The heart size and mediastinal contours are within normal limits. No pneumothorax is noted. Mild bilateral pleural effusions are noted, left greater than right. Probable mild left basilar atelectasis or scarring is noted. The visualized skeletal structures are unremarkable. IMPRESSION: Mild bilateral pleural effusions, left greater than right. Probable mild left basilar atelectasis or scarring. Electronically Signed   By: Lupita RaiderJames  Green Jr, M.D.   On: 12/31/15 12:25   [place] Renal  Result Date: 07/18/2016 CLINICAL DATA:   acute renal injury EXAM: RENAL / URINARY TRACT ULTRASOUND COMPLETE COMPARISON:  Ultrasound 01/04/1959 FINDINGS: Right Kidney: Length: 11.7 cm. Echogenicity within normal limits. No mass or hydronephrosis visualized. Do case collecting system Left Kidney: Length: 8.2 cm. Cortical thinning with increased echogenicity. Previously LEFT kidney not identified. Bladder: Mild bladder wall thickening. Small amount of debris within the bladder. IMPRESSION: 1. No RIGHT hydronephrosis.  Duplicated collecting system 2. Questionable LEFT kidney identified. Previous ultrasounds did not identified LEFT kidney 3. Thick-walled bladder or debris. Correlate clinically for cystitis. Electronically Signed   By: Genevive BiStewart  Edmunds M.D.   On: 07/18/2016 12:02   Dg Foot Complete Right  Result Date: 07/18/2016 CLINICAL DATA:  Top of right foot pain. EXAM: RIGHT FOOT COMPLETE - 3+ VIEW COMPARISON:  None. FINDINGS: No acute fracture or dislocation. Juxta-articular erosions around the first MTP joint which is affected by advanced hallux valgus. Suspect previous resection of the second middle phalanx where there is bone loss with chronic marginated appearance. Marked osteopenia. IMPRESSION: 1. No acute osseous finding. 2. Gouty changes and hallux valgus. Electronically Signed   By: Marnee SpringJonathon  Watts M.D.   On: 07/18/2016 15:52    I have reviewed the patient's current medications.  Assessment/Plan: Problem #1 acute kidney injury superimposed on chronic. Presently is pending creatinine stable and patient is asymptomatic. The etiology could be prerenal/ATN/obstructive uropathy. Presently Not able to put the Foley catheter. Presently no urine output. Problem #2 hypotension: Possibly from dehydration but cannot rule out sepsis. Presently patient is a febrile and his white blood cell count is normal. But  unable to check his UA. Ultrasound questionable cystitis. Problem #3 hyperkalemia: His potassium has corrected Problem #4 history of  gout Problem #5 . Metabolic bone disease: His calcium and phosphorus is range Problem #6  possible metabolic acidosis: Patient is on sodium bicarbonate but his CO2 is low. Plan: We'll change his IV fluid to 1/4 saline with 100 mEq of sodium bicarbonate at 125 mL per hour 2] because of persistent hypotension at this moment we may need to treat  patient for possible sepsis  ie possible UTI and do blood culture. 3] we'll check his renal panel and CBC in the morning 4] at this moment because of his hypotension we'll hold using diuretics. 5] patient may benefit from Foley catheter placement and sending urine for culture and sensitivity.   LOS: 2 days   Jesse Mccarthy S 07/19/2016,9:22 AM

## 2016-07-20 ENCOUNTER — Inpatient Hospital Stay (HOSPITAL_COMMUNITY): Payer: Commercial Managed Care - HMO

## 2016-07-20 LAB — CBC
HCT: 31.8 % — ABNORMAL LOW (ref 39.0–52.0)
Hemoglobin: 10.4 g/dL — ABNORMAL LOW (ref 13.0–17.0)
MCH: 30.6 pg (ref 26.0–34.0)
MCHC: 32.7 g/dL (ref 30.0–36.0)
MCV: 93.5 fL (ref 78.0–100.0)
PLATELETS: 244 10*3/uL (ref 150–400)
RBC: 3.4 MIL/uL — ABNORMAL LOW (ref 4.22–5.81)
RDW: 18.1 % — AB (ref 11.5–15.5)
WBC: 7.4 10*3/uL (ref 4.0–10.5)

## 2016-07-20 LAB — MPO/PR-3 (ANCA) ANTIBODIES: Myeloperoxidase Abs: <9 U/mL (ref 0.0–9.0)

## 2016-07-20 LAB — RENAL FUNCTION PANEL
ANION GAP: 14 (ref 5–15)
Albumin: 2.9 g/dL — ABNORMAL LOW (ref 3.5–5.0)
BUN: 41 mg/dL — AB (ref 6–20)
CALCIUM: 8.5 mg/dL — AB (ref 8.9–10.3)
CO2: 18 mmol/L — AB (ref 22–32)
CREATININE: 3.56 mg/dL — AB (ref 0.61–1.24)
Chloride: 108 mmol/L (ref 101–111)
GFR calc Af Amer: 18 mL/min — ABNORMAL LOW (ref >60)
GFR calc non Af Amer: 15 mL/min — ABNORMAL LOW (ref >60)
GLUCOSE: 106 mg/dL — AB (ref 65–99)
Phosphorus: 5 mg/dL — ABNORMAL HIGH (ref 2.5–4.6)
Potassium: 4.6 mmol/L (ref 3.5–5.1)
SODIUM: 140 mmol/L (ref 135–145)

## 2016-07-20 LAB — BASIC METABOLIC PANEL
Anion gap: 14 (ref 5–15)
BUN: 42 mg/dL — ABNORMAL HIGH (ref 6–20)
CALCIUM: 8.5 mg/dL — AB (ref 8.9–10.3)
CO2: 18 mmol/L — AB (ref 22–32)
CREATININE: 3.56 mg/dL — AB (ref 0.61–1.24)
Chloride: 108 mmol/L (ref 101–111)
GFR calc Af Amer: 18 mL/min — ABNORMAL LOW (ref >60)
GFR calc non Af Amer: 15 mL/min — ABNORMAL LOW (ref >60)
GLUCOSE: 108 mg/dL — AB (ref 65–99)
Potassium: 4.6 mmol/L (ref 3.5–5.1)
Sodium: 140 mmol/L (ref 135–145)

## 2016-07-20 LAB — COMPLEMENT, TOTAL: Compl, Total (CH50): 46 U/mL (ref 42–60)

## 2016-07-20 MED ORDER — FUROSEMIDE 10 MG/ML IJ SOLN
INTRAMUSCULAR | Status: AC
  Filled 2016-07-20: qty 10

## 2016-07-20 MED ORDER — FUROSEMIDE 10 MG/ML IJ SOLN
100.0000 mg | Freq: Two times a day (BID) | INTRAVENOUS | Status: DC
  Administered 2016-07-20: 100 mg via INTRAVENOUS
  Filled 2016-07-20 (×4): qty 10

## 2016-07-20 NOTE — Progress Notes (Signed)
Bladder Scan performed - .  Md informed.

## 2016-07-20 NOTE — Progress Notes (Signed)
Pt has not voided Bladder scan shows 200 mls.  Pt is without complaints but is winded easily with talking. Dr Karilyn CotaGosrani made aware and orders received.

## 2016-07-20 NOTE — Progress Notes (Signed)
Subjective:  Slightly lightheaded, mild dyspnea better with supplemental O2, good appetite; occasional urge to urinate, but unable  Objective: Vital signs in last 24 hours: Temp:  [97.5 F (36.4 C)-98.2 F (36.8 C)] 97.5 F (36.4 C) (11/25 0445) Pulse Rate:  [83-98] 94 (11/25 0445) Resp:  [16-22] 22 (11/25 0445) BP: (76-89)/(50-64) 76/50 (11/25 0445) SpO2:  [83 %-96 %] 83 % (11/25 0445) Weight:  [66.4 kg (146 lb 6.4 oz)] 66.4 kg (146 lb 6.4 oz) (11/25 0445) Weight change: 2.495 kg (5 lb 8 oz)  Intake/Output from previous day: 11/24 0701 - 11/25 0700 In: 2393.8 [I.V.:2393.8] Out: 50 [Urine:50] Intake/Output this shift: Total I/O In: 240 [P.O.:240] Out: -    EXAM: General appearance: Alert, cooperative, in no apparent distress Resp:  Clear to auscultation bilaterally Cardio:  RRR without murmur or rub GI: +BS, soft and nontender Extremities: 1+ edema bilaterally  Lab Results:  Recent Labs  07/20/16 0636  WBC 7.4  HGB 10.4*  HCT 31.8*  PLT 244   BMET:  Recent Labs  07/19/16 0615 07/20/16 0636  NA 141 140  140  K 4.8 4.6  4.6  CL 116* 108  108  CO2 14* 18*  18*  GLUCOSE 86 106*  108*  BUN 37* 41*  42*  CREATININE 3.28* 3.56*  3.56*  CALCIUM 8.8* 8.5*  8.5*  ALBUMIN 3.0* 2.9*   No results for input(s): PTH in the last 72 hours. Iron Studies: No results for input(s): IRON, TIBC, TRANSFERRIN, FERRITIN in the last 72 hours.  Studies/Results: No results found.   Assessment/Plan: 1. Acute kidney injury superimposed on chronic - Creatinine is increasing, now at 3.56, and he has no urine output, but remains asymptomatic. The etiology could be prerenal/ATN/obstructive uropathy, possibly bladder neck or prostatic urethra, but currently no retention per Urology. 2. Hypotension - Possibly from dehydration.  Patient is afebrile with normal WBCs, so infection is unlikely.  He remains on IVF at 125 ml/hr.  3. Hyperkalemia - His potassium has  corrected. 4. History of gout - Naproxen, Allopurinol, & Colchicine were stopped on admission. 5. Metabolic bone disease: His calcium, corrected 9.4, and phosphorus are in range. 6. Possible metabolic acidosis - Patient is on sodium bicarbonate per IVF, and his CO2 is improving.   Plan:  1.  Decrease IVF to 50 ml/hr.           2.  Renal panel and CBC tomorrow AM.   LOS: 3 days   LYLES,CHARLES 07/20/2016,8:39 AM

## 2016-07-20 NOTE — Progress Notes (Signed)
Report called to Dr Befakadu perFausto Skillern request of Dr Raquel JamesGasrani. Orders received

## 2016-07-20 NOTE — Progress Notes (Signed)
Subjective: Patient feels slightly better. He has no urine out put since 2 days. His bladder scan showed only 165 ml. Patient is being followed by nephrology and urology.  Objective: Vital signs in last 24 hours: Temp:  [97.5 F (36.4 C)-98.2 F (36.8 C)] 97.5 F (36.4 C) (11/25 0445) Pulse Rate:  [83-98] 94 (11/25 0445) Resp:  [16-22] 22 (11/25 0445) BP: (76-89)/(50-64) 76/50 (11/25 0445) SpO2:  [83 %-96 %] 83 % (11/25 0445) Weight:  [66.4 kg (146 lb 6.4 oz)] 66.4 kg (146 lb 6.4 oz) (11/25 0445) Weight change: 2.495 kg (5 lb 8 oz) Last BM Date: 07/19/16  Intake/Output from previous day: 11/24 0701 - 11/25 0700 In: 2393.8 [I.V.:2393.8] Out: 50 [Urine:50]  PHYSICAL EXAM General appearance: alert and no distress Resp: clear to auscultation bilaterally Cardio: S1, S2 normal GI: soft, non-tender; bowel sounds normal; no masses,  no organomegaly Extremities: extremities normal, atraumatic, no cyanosis or edema  Lab Results:  Results for orders placed or performed during the hospital encounter of 07/01/2016 (from the past 48 hour(s))  Renal function panel     Status: Abnormal   Collection Time: 07/19/16  6:15 AM  Result Value Ref Range   Sodium 141 135 - 145 mmol/L   Potassium 4.8 3.5 - 5.1 mmol/L    Comment: DELTA CHECK NOTED   Chloride 116 (H) 101 - 111 mmol/L   CO2 14 (L) 22 - 32 mmol/L   Glucose, Bld 86 65 - 99 mg/dL   BUN 37 (H) 6 - 20 mg/dL   Creatinine, Ser 3.28 (H) 0.61 - 1.24 mg/dL   Calcium 8.8 (L) 8.9 - 10.3 mg/dL   Phosphorus 4.7 (H) 2.5 - 4.6 mg/dL   Albumin 3.0 (L) 3.5 - 5.0 g/dL   GFR calc non Af Amer 17 (L) >60 mL/min   GFR calc Af Amer 20 (L) >60 mL/min    Comment: (NOTE) The eGFR has been calculated using the CKD EPI equation. This calculation has not been validated in all clinical situations. eGFR's persistently <60 mL/min signify possible Chronic Kidney Disease.    Anion gap 11 5 - 15  Basic metabolic panel     Status: Abnormal   Collection Time:  07/20/16  6:36 AM  Result Value Ref Range   Sodium 140 135 - 145 mmol/L   Potassium 4.6 3.5 - 5.1 mmol/L   Chloride 108 101 - 111 mmol/L   CO2 18 (L) 22 - 32 mmol/L   Glucose, Bld 108 (H) 65 - 99 mg/dL   BUN 42 (H) 6 - 20 mg/dL   Creatinine, Ser 3.56 (H) 0.61 - 1.24 mg/dL   Calcium 8.5 (L) 8.9 - 10.3 mg/dL   GFR calc non Af Amer 15 (L) >60 mL/min   GFR calc Af Amer 18 (L) >60 mL/min    Comment: (NOTE) The eGFR has been calculated using the CKD EPI equation. This calculation has not been validated in all clinical situations. eGFR's persistently <60 mL/min signify possible Chronic Kidney Disease.    Anion gap 14 5 - 15  CBC     Status: Abnormal   Collection Time: 07/20/16  6:36 AM  Result Value Ref Range   WBC 7.4 4.0 - 10.5 K/uL   RBC 3.40 (L) 4.22 - 5.81 MIL/uL   Hemoglobin 10.4 (L) 13.0 - 17.0 g/dL   HCT 31.8 (L) 39.0 - 52.0 %   MCV 93.5 78.0 - 100.0 fL   MCH 30.6 26.0 - 34.0 pg   MCHC 32.7 30.0 -  36.0 g/dL   RDW 18.1 (H) 11.5 - 15.5 %   Platelets 244 150 - 400 K/uL  Renal function panel     Status: Abnormal   Collection Time: 07/20/16  6:36 AM  Result Value Ref Range   Sodium 140 135 - 145 mmol/L   Potassium 4.6 3.5 - 5.1 mmol/L   Chloride 108 101 - 111 mmol/L   CO2 18 (L) 22 - 32 mmol/L   Glucose, Bld 106 (H) 65 - 99 mg/dL   BUN 41 (H) 6 - 20 mg/dL   Creatinine, Ser 3.56 (H) 0.61 - 1.24 mg/dL   Calcium 8.5 (L) 8.9 - 10.3 mg/dL   Phosphorus 5.0 (H) 2.5 - 4.6 mg/dL   Albumin 2.9 (L) 3.5 - 5.0 g/dL   GFR calc non Af Amer 15 (L) >60 mL/min   GFR calc Af Amer 18 (L) >60 mL/min    Comment: (NOTE) The eGFR has been calculated using the CKD EPI equation. This calculation has not been validated in all clinical situations. eGFR's persistently <60 mL/min signify possible Chronic Kidney Disease.    Anion gap 14 5 - 15    ABGS No results for input(s): PHART, PO2ART, TCO2, HCO3 in the last 72 hours.  Invalid input(s): PCO2 CULTURES No results found for this or any  previous visit (from the past 240 hour(s)). Studies/Results: Dg Foot Complete Right  Result Date: 07/18/2016 CLINICAL DATA:  Top of right foot pain. EXAM: RIGHT FOOT COMPLETE - 3+ VIEW COMPARISON:  None. FINDINGS: No acute fracture or dislocation. Juxta-articular erosions around the first MTP joint which is affected by advanced hallux valgus. Suspect previous resection of the second middle phalanx where there is bone loss with chronic marginated appearance. Marked osteopenia. IMPRESSION: 1. No acute osseous finding. 2. Gouty changes and hallux valgus. Electronically Signed   By: Monte Fantasia M.D.   On: 07/18/2016 15:52    Medications: I have reviewed the patient's current medications.  Assesment:   Principal Problem:   AKI (acute kidney injury) ([place]) Active Problems:   Gout   BPH (benign prostatic hyperplasia)   Bilateral leg edema   Volume depletion    Plan:  Medications reviewed Continue iv hydration Will continue to monitor renal function Continue according urology and nephrology recommendations.    LOS: 3 days   Broox Lonigro 07/20/2016, 11:03 AM

## 2016-07-21 ENCOUNTER — Inpatient Hospital Stay (HOSPITAL_COMMUNITY): Payer: Commercial Managed Care - HMO

## 2016-07-21 DIAGNOSIS — N358 Other urethral stricture: Secondary | ICD-10-CM

## 2016-07-21 DIAGNOSIS — N320 Bladder-neck obstruction: Secondary | ICD-10-CM

## 2016-07-21 LAB — RENAL FUNCTION PANEL
ALBUMIN: 2.8 g/dL — AB (ref 3.5–5.0)
Anion gap: 13 (ref 5–15)
BUN: 44 mg/dL — AB (ref 6–20)
CHLORIDE: 105 mmol/L (ref 101–111)
CO2: 20 mmol/L — ABNORMAL LOW (ref 22–32)
CREATININE: 3.66 mg/dL — AB (ref 0.61–1.24)
Calcium: 8.3 mg/dL — ABNORMAL LOW (ref 8.9–10.3)
GFR, EST AFRICAN AMERICAN: 17 mL/min — AB (ref >60)
GFR, EST NON AFRICAN AMERICAN: 15 mL/min — AB (ref >60)
Glucose, Bld: 117 mg/dL — ABNORMAL HIGH (ref 65–99)
PHOSPHORUS: 5 mg/dL — AB (ref 2.5–4.6)
Potassium: 4 mmol/L (ref 3.5–5.1)
Sodium: 138 mmol/L (ref 135–145)

## 2016-07-21 LAB — CBC
HEMATOCRIT: 30 % — AB (ref 39.0–52.0)
Hemoglobin: 9.9 g/dL — ABNORMAL LOW (ref 13.0–17.0)
MCH: 30.8 pg (ref 26.0–34.0)
MCHC: 33 g/dL (ref 30.0–36.0)
MCV: 93.5 fL (ref 78.0–100.0)
PLATELETS: 220 10*3/uL (ref 150–400)
RBC: 3.21 MIL/uL — AB (ref 4.22–5.81)
RDW: 17.9 % — AB (ref 11.5–15.5)
WBC: 7.3 10*3/uL (ref 4.0–10.5)

## 2016-07-21 LAB — BASIC METABOLIC PANEL
Anion gap: 12 (ref 5–15)
BUN: 45 mg/dL — ABNORMAL HIGH (ref 6–20)
CHLORIDE: 106 mmol/L (ref 101–111)
CO2: 20 mmol/L — ABNORMAL LOW (ref 22–32)
Calcium: 8.3 mg/dL — ABNORMAL LOW (ref 8.9–10.3)
Creatinine, Ser: 3.63 mg/dL — ABNORMAL HIGH (ref 0.61–1.24)
GFR, EST AFRICAN AMERICAN: 17 mL/min — AB (ref >60)
GFR, EST NON AFRICAN AMERICAN: 15 mL/min — AB (ref >60)
Glucose, Bld: 117 mg/dL — ABNORMAL HIGH (ref 65–99)
POTASSIUM: 4 mmol/L (ref 3.5–5.1)
SODIUM: 138 mmol/L (ref 135–145)

## 2016-07-21 LAB — URINALYSIS, ROUTINE W REFLEX MICROSCOPIC
BILIRUBIN URINE: NEGATIVE
GLUCOSE, UA: NEGATIVE mg/dL
KETONES UR: NEGATIVE mg/dL
Nitrite: NEGATIVE
PROTEIN: NEGATIVE mg/dL
Specific Gravity, Urine: 1.01 (ref 1.005–1.030)
pH: 5.5 (ref 5.0–8.0)

## 2016-07-21 LAB — URINE MICROSCOPIC-ADD ON

## 2016-07-21 LAB — MRSA PCR SCREENING: MRSA BY PCR: NEGATIVE

## 2016-07-21 MED ORDER — METHYLPREDNISOLONE SODIUM SUCC 125 MG IJ SOLR
80.0000 mg | Freq: Four times a day (QID) | INTRAMUSCULAR | Status: DC
  Administered 2016-07-21 – 2016-07-22 (×5): 80 mg via INTRAVENOUS
  Filled 2016-07-21 (×5): qty 2

## 2016-07-21 MED ORDER — DEXTROSE 5 % IV SOLN
1.0000 g | Freq: Once | INTRAVENOUS | Status: AC
  Administered 2016-07-21: 1 g via INTRAVENOUS
  Filled 2016-07-21: qty 10

## 2016-07-21 MED ORDER — FUROSEMIDE 10 MG/ML IJ SOLN
160.0000 mg | Freq: Two times a day (BID) | INTRAVENOUS | Status: DC
  Filled 2016-07-21 (×2): qty 16

## 2016-07-21 MED ORDER — FUROSEMIDE 10 MG/ML IJ SOLN
160.0000 mg | Freq: Two times a day (BID) | INTRAVENOUS | Status: DC
  Administered 2016-07-21: 160 mg via INTRAVENOUS
  Filled 2016-07-21 (×3): qty 16

## 2016-07-21 MED ORDER — SODIUM CHLORIDE 0.9 % IV SOLN
INTRAVENOUS | Status: DC
  Administered 2016-07-21 – 2016-07-22 (×4): via INTRAVENOUS

## 2016-07-21 NOTE — Progress Notes (Signed)
Subjective: Patient was transferred to ICU due to shortness of breath. His chest x-ray showing vascular congestion. He is also wheezing. He has history tobacco use.  Objective: Vital signs in last 24 hours: Temp:  [97 F (36.1 C)-98.5 F (36.9 C)] 97.3 F (36.3 C) (11/26 0758) Pulse Rate:  [86-106] 92 (11/26 1004) Resp:  [16-31] 30 (11/26 1004) BP: (81-99)/(56-75) 90/69 (11/26 1004) SpO2:  [89 %-100 %] 100 % (11/26 1004) Weight:  [68.2 kg (150 lb 5.7 oz)] 68.2 kg (150 lb 5.7 oz) (11/26 0500) Weight change: 1.793 kg (3 lb 15.3 oz) Last BM Date: 07/19/16  Intake/Output from previous day: 11/25 0701 - 11/26 0700 In: 1996.7 [P.O.:720; I.V.:1216.7; IV Piggyback:60] Out: 650 [Urine:650]  PHYSICAL EXAM General appearance: alert and no distress Resp: diminished breath sounds bilaterally and wheezes bilaterally Cardio: S1, S2 normal GI: soft, non-tender; bowel sounds normal; no masses,  no organomegaly Extremities: extremities normal, atraumatic, no cyanosis or edema  Lab Results:  Results for orders placed or performed during the hospital encounter of 06/26/2016 (from the past 48 hour(s))  Basic metabolic panel     Status: Abnormal   Collection Time: 07/20/16  6:36 AM  Result Value Ref Range   Sodium 140 135 - 145 mmol/L   Potassium 4.6 3.5 - 5.1 mmol/L   Chloride 108 101 - 111 mmol/L   CO2 18 (L) 22 - 32 mmol/L   Glucose, Bld 108 (H) 65 - 99 mg/dL   BUN 42 (H) 6 - 20 mg/dL   Creatinine, Ser 3.56 (H) 0.61 - 1.24 mg/dL   Calcium 8.5 (L) 8.9 - 10.3 mg/dL   GFR calc non Af Amer 15 (L) >60 mL/min   GFR calc Af Amer 18 (L) >60 mL/min    Comment: (NOTE) The eGFR has been calculated using the CKD EPI equation. This calculation has not been validated in all clinical situations. eGFR's persistently <60 mL/min signify possible Chronic Kidney Disease.    Anion gap 14 5 - 15  CBC     Status: Abnormal   Collection Time: 07/20/16  6:36 AM  Result Value Ref Range   WBC 7.4 4.0 - 10.5  K/uL   RBC 3.40 (L) 4.22 - 5.81 MIL/uL   Hemoglobin 10.4 (L) 13.0 - 17.0 g/dL   HCT 31.8 (L) 39.0 - 52.0 %   MCV 93.5 78.0 - 100.0 fL   MCH 30.6 26.0 - 34.0 pg   MCHC 32.7 30.0 - 36.0 g/dL   RDW 18.1 (H) 11.5 - 15.5 %   Platelets 244 150 - 400 K/uL  Renal function panel     Status: Abnormal   Collection Time: 07/20/16  6:36 AM  Result Value Ref Range   Sodium 140 135 - 145 mmol/L   Potassium 4.6 3.5 - 5.1 mmol/L   Chloride 108 101 - 111 mmol/L   CO2 18 (L) 22 - 32 mmol/L   Glucose, Bld 106 (H) 65 - 99 mg/dL   BUN 41 (H) 6 - 20 mg/dL   Creatinine, Ser 3.56 (H) 0.61 - 1.24 mg/dL   Calcium 8.5 (L) 8.9 - 10.3 mg/dL   Phosphorus 5.0 (H) 2.5 - 4.6 mg/dL   Albumin 2.9 (L) 3.5 - 5.0 g/dL   GFR calc non Af Amer 15 (L) >60 mL/min   GFR calc Af Amer 18 (L) >60 mL/min    Comment: (NOTE) The eGFR has been calculated using the CKD EPI equation. This calculation has not been validated in all clinical situations. eGFR's persistently <  60 mL/min signify possible Chronic Kidney Disease.    Anion gap 14 5 - 15  MRSA PCR Screening     Status: None   Collection Time: 07/20/16  6:41 PM  Result Value Ref Range   MRSA by PCR NEGATIVE NEGATIVE    Comment:        The GeneXpert MRSA Assay (FDA approved for NASAL specimens only), is one component of a comprehensive MRSA colonization surveillance program. It is not intended to diagnose MRSA infection nor to guide or monitor treatment for MRSA infections.   Basic metabolic panel     Status: Abnormal   Collection Time: 07/21/16  4:43 AM  Result Value Ref Range   Sodium 138 135 - 145 mmol/L   Potassium 4.0 3.5 - 5.1 mmol/L   Chloride 106 101 - 111 mmol/L   CO2 20 (L) 22 - 32 mmol/L   Glucose, Bld 117 (H) 65 - 99 mg/dL   BUN 45 (H) 6 - 20 mg/dL   Creatinine, Ser 3.63 (H) 0.61 - 1.24 mg/dL   Calcium 8.3 (L) 8.9 - 10.3 mg/dL   GFR calc non Af Amer 15 (L) >60 mL/min   GFR calc Af Amer 17 (L) >60 mL/min    Comment: (NOTE) The eGFR has been  calculated using the CKD EPI equation. This calculation has not been validated in all clinical situations. eGFR's persistently <60 mL/min signify possible Chronic Kidney Disease.    Anion gap 12 5 - 15  CBC     Status: Abnormal   Collection Time: 07/21/16  4:43 AM  Result Value Ref Range   WBC 7.3 4.0 - 10.5 K/uL   RBC 3.21 (L) 4.22 - 5.81 MIL/uL   Hemoglobin 9.9 (L) 13.0 - 17.0 g/dL   HCT 30.0 (L) 39.0 - 52.0 %   MCV 93.5 78.0 - 100.0 fL   MCH 30.8 26.0 - 34.0 pg   MCHC 33.0 30.0 - 36.0 g/dL   RDW 17.9 (H) 11.5 - 15.5 %   Platelets 220 150 - 400 K/uL  Renal function panel     Status: Abnormal   Collection Time: 07/21/16  4:45 AM  Result Value Ref Range   Sodium 138 135 - 145 mmol/L   Potassium 4.0 3.5 - 5.1 mmol/L   Chloride 105 101 - 111 mmol/L   CO2 20 (L) 22 - 32 mmol/L   Glucose, Bld 117 (H) 65 - 99 mg/dL   BUN 44 (H) 6 - 20 mg/dL   Creatinine, Ser 3.66 (H) 0.61 - 1.24 mg/dL   Calcium 8.3 (L) 8.9 - 10.3 mg/dL   Phosphorus 5.0 (H) 2.5 - 4.6 mg/dL   Albumin 2.8 (L) 3.5 - 5.0 g/dL   GFR calc non Af Amer 15 (L) >60 mL/min   GFR calc Af Amer 17 (L) >60 mL/min    Comment: (NOTE) The eGFR has been calculated using the CKD EPI equation. This calculation has not been validated in all clinical situations. eGFR's persistently <60 mL/min signify possible Chronic Kidney Disease.    Anion gap 13 5 - 15    ABGS No results for input(s): PHART, PO2ART, TCO2, HCO3 in the last 72 hours.  Invalid input(s): PCO2 CULTURES Recent Results (from the past 240 hour(s))  MRSA PCR Screening     Status: None   Collection Time: 07/20/16  6:41 PM  Result Value Ref Range Status   MRSA by PCR NEGATIVE NEGATIVE Final    Comment:        The  GeneXpert MRSA Assay (FDA approved for NASAL specimens only), is one component of a comprehensive MRSA colonization surveillance program. It is not intended to diagnose MRSA infection nor to guide or monitor treatment for MRSA infections.     Studies/Results: Dg Chest 1 View  Result Date: 07/20/2016 CLINICAL DATA:  Labored respiration EXAM: CHEST 1 VIEW COMPARISON:  07/12/2016 FINDINGS: Borderline cardiomegaly. Central vascular congestion. Hazy airspace disease and interstitial prominence bilateral perihilar and lower lobes highly suspicious for pulmonary edema. Less likely bilateral pneumonia. Probable small bilateral pleural effusion. IMPRESSION: Central vascular congestion. Hazy airspace disease and interstitial prominence bilateral perihilar and lower lobes highly suspicious for pulmonary edema. Less likely bilateral pneumonia. Probable small bilateral pleural effusion. Electronically Signed   By: Lahoma Crocker M.D.   On: 07/20/2016 16:00    Medications: I have reviewed the patient's current medications.  Assesment:   Principal Problem:   AKI (acute kidney injury) ([place]) Active Problems:   Gout   BPH (benign prostatic hyperplasia)   Bilateral leg edema   Volume depletion shortness of breath   Plan:  Medications reviewed Continue iv hydration Will continue to monitor renal function Will start on IV solumedrol    LOS: 4 days   Avanell Banwart 07/21/2016, 11:12 AM

## 2016-07-21 NOTE — Progress Notes (Signed)
Subjective:  Patient says that he is feeling much better this morning. He denies any difficulty breathing. Patient also denies any nausea or vomiting.  Objective: Vital signs in last 24 hours: Temp:  [97 F (36.1 C)-98.5 F (36.9 C)] 97.3 F (36.3 C) (11/26 0758) Pulse Rate:  [86-106] 92 (11/26 0500) Resp:  [16-31] 18 (11/26 0700) BP: (81-99)/(56-75) 94/66 (11/26 0700) SpO2:  [89 %-99 %] 94 % (11/26 0700) Weight:  [68.2 kg (150 lb 5.7 oz)] 68.2 kg (150 lb 5.7 oz) (11/26 0500) Weight change: 1.793 kg (3 lb 15.3 oz)  Intake/Output from previous day: 11/25 0701 - 11/26 0700 In: 1996.7 [P.O.:720; I.V.:1216.7; IV Piggyback:60] Out: 650 [Urine:650] Intake/Output this shift: No intake/output data recorded.   EXAM: General appearance: Alert, cooperative, in no apparent distress Resp:  Clear to auscultation bilaterally Cardio:  RRR without murmur or rub GI: +BS, soft and nontender Extremities: 1+ edema bilaterally  Lab Results:  Recent Labs  07/20/16 0636 07/21/16 0443  WBC 7.4 7.3  HGB 10.4* 9.9*  HCT 31.8* 30.0*  PLT 244 220   BMET:   Recent Labs  07/20/16 0636 07/21/16 0443 07/21/16 0445  NA 140  140 138 138  K 4.6  4.6 4.0 4.0  CL 108  108 106 105  CO2 18*  18* 20* 20*  GLUCOSE 106*  108* 117* 117*  BUN 41*  42* 45* 44*  CREATININE 3.56*  3.56* 3.63* 3.66*  CALCIUM 8.5*  8.5* 8.3* 8.3*  ALBUMIN 2.9*  --  2.8*   No results for input(s): PTH in the last 72 hours. Iron Studies: No results for input(s): IRON, TIBC, TRANSFERRIN, FERRITIN in the last 72 hours.  Studies/Results: Dg Chest 1 View  Result Date: 07/20/2016 CLINICAL DATA:  Labored respiration EXAM: CHEST 1 VIEW COMPARISON:  07/03/2016 FINDINGS: Borderline cardiomegaly. Central vascular congestion. Hazy airspace disease and interstitial prominence bilateral perihilar and lower lobes highly suspicious for pulmonary edema. Less likely bilateral pneumonia. Probable small bilateral pleural effusion.  IMPRESSION: Central vascular congestion. Hazy airspace disease and interstitial prominence bilateral perihilar and lower lobes highly suspicious for pulmonary edema. Less likely bilateral pneumonia. Probable small bilateral pleural effusion. Electronically Signed   By: Natasha MeadLiviu  Pop M.D.   On: 07/20/2016 16:00     Assessment/Plan: 1. Acute kidney injury superimposed on chronic - . The etiology could be prerenal/ATN/obstructive uropathy. His BUN and creatinine at this moment continue to increase. Patient however does not have any uremic signs and symptoms. . 2. Hypotension - Possibly from dehydration. His blood pressure is somewhat better. 3. Hyperkalemia - His potassium has corrected. 4. History of gout -presently no recent activation. 5. Metabolic bone disease: His calcium, corrected 9.4, and phosphorus are in range. 6. Possible metabolic acidosis - Patient is on sodium bicarbonate per IVF, and his CO2 is improving.  7. History of CHF: Patient was having some difficulty breathing. Started on Lasix last night. He made about 650 mL's of urine and presently feels much better.  Plan:  1. We'll DC his IV fluid.           2.  Renal panel and CBC tomorrow AM.           3. We'll increase Lasix to 160 mg IV twice a day. If his urine output doesn't improve or if started having difficulty breathing again we'll consider initiating dialysis.           4. We'll do ultrasound of the kidneys.   LOS: 4 days  Krissia Schreier S 07/21/2016,8:18 AM

## 2016-07-21 NOTE — Op Note (Signed)
Preoperative diagnosis: Urethral stricture  Postoperative diagnosis: Same.  Procedure: Cystoscopy, balloon dilation of bladder neck contracture/urethral stricture, placement of council tip catheter (difficult)  Surgeon: Ahaan Zobrist  Anesthesia: 2% viscous lidocaine within urethra.  Complications: None  Drains: 16 French council tip catheter to bedside bag.  Indications: 75 year old male with impassable urethral stricture using of catheter.  The patient has a significant residual urine volume.  Because of a large post void residual urine volume, as well as difficult urination and brisk diuresis, the patient requires catheter drainage.  Description of procedure: The patient was properly identified.  Genitalia and perineum were prepped and draped.  Urethra was anesthetized with 2% viscous lidocaine.  Flexible cystoscope was advanced through the urethra.  There was a dense posterior urethral stricture.  A 0.038 inch sensor-tip guidewire was advanced through this.  Once this was felt to be curled within the bladder, the cystoscope was removed, with the guidewire left in place.  I then negotiated a 6 [place] open-ended catheter over top of the guidewire, to where I thought was the bladder.  The guidewire was removed.  I aspirated urine with a 10 mL syringe.  This verify position of the open-ended catheter within the bladder.  The guidewire was then replaced and the open-ended catheter removed.  I then placed a NephroMax balloon over top of the guidewire, and advanced this into the proximal urethra.  Once I felt this was adequately positioned across the posterior urethral stricture and into the bladder, the balloon was inflated to 20 atmospheres of pressure for a proximally 5 minutes.  The patient tolerated this dilation well.  Following 5 minutes, the balloon was deflated and removed.  I then passed a 16 [place] council tip catheter over top of the guidewire, easily negotiating and into the bladder.  The  balloon was filled with 10 mL of water.  The catheter was then hooked to dependent drainage with a Foley bag.  The patient tolerated procedure well.

## 2016-07-21 NOTE — Progress Notes (Signed)
Pt bladder scan = >999cc Dr's befakdu Karilyn Cota/Gosrani notified. Urology MD paged & is coming to intervene. Nursing supervisor will bring urology cart.

## 2016-07-22 ENCOUNTER — Inpatient Hospital Stay (HOSPITAL_COMMUNITY): Payer: Commercial Managed Care - HMO

## 2016-07-22 DIAGNOSIS — R600 Localized edema: Secondary | ICD-10-CM

## 2016-07-22 LAB — RENAL FUNCTION PANEL
Albumin: 2.8 g/dL — ABNORMAL LOW (ref 3.5–5.0)
Anion gap: 14 (ref 5–15)
BUN: 50 mg/dL — ABNORMAL HIGH (ref 6–20)
CALCIUM: 8.2 mg/dL — AB (ref 8.9–10.3)
CHLORIDE: 106 mmol/L (ref 101–111)
CO2: 19 mmol/L — ABNORMAL LOW (ref 22–32)
CREATININE: 3.63 mg/dL — AB (ref 0.61–1.24)
GFR, EST AFRICAN AMERICAN: 17 mL/min — AB (ref >60)
GFR, EST NON AFRICAN AMERICAN: 15 mL/min — AB (ref >60)
Glucose, Bld: 163 mg/dL — ABNORMAL HIGH (ref 65–99)
Phosphorus: 6.2 mg/dL — ABNORMAL HIGH (ref 2.5–4.6)
Potassium: 4.2 mmol/L (ref 3.5–5.1)
Sodium: 139 mmol/L (ref 135–145)

## 2016-07-22 LAB — PROTEIN, URINE, 24 HOUR
Collection Interval-UPROT: 24 hours
PROTEIN, 24H URINE: 360 mg/d — AB (ref 50–100)
Protein, Urine: 36 mg/dL
Urine Total Volume-UPROT: 1000 mL

## 2016-07-22 LAB — ANTINUCLEAR ANTIBODIES, IFA: ANTINUCLEAR ANTIBODIES, IFA: NEGATIVE

## 2016-07-23 LAB — URINE CULTURE: Culture: >=100000 — AB

## 2016-07-23 MED FILL — Medication: Qty: 1 | Status: AC

## 2016-07-26 LAB — CULTURE, BLOOD (ROUTINE X 2)
Culture: NO GROWTH
Culture: NO GROWTH

## 2016-07-26 NOTE — Code Documentation (Signed)
RESPONDING TO CODE BLUE:  Upon arrival, patient has bradycardia with HR 37 and no pulse. Atropine was given with CPR initiated. IVF wide opened. Patient was subsequently intubated by EDP Dr Elbert EwingsZackoswki. Following ACLS Protocol, he received 4 runs of Epinephrine. He has no ROSC the entire 20 minutes.  At the 3rd round of Epi, Bicarb and D50 were given also. Attending Dr Letitia NeriFanta's arrival, and subsequently called off Code Blue. He will speak with the family.  Houston SirenPeter Jacquelin Krajewski MD FACP. Hospitalist.

## 2016-07-26 NOTE — ED Provider Notes (Signed)
Called to see patient today for CODE BLUE in the ICU. Patient was ongoing CPR not breathing well on his own. Was intubated by me. Intubation without difficulty and was successful.  CODE BLUE being run by the hospitalist. Airway intubation provided by me.  INTUBATION Performed by: Leslieanne Cobarrubias  Required items: required blood products, implants, devices, and special equipment available Patient identity confirmed: provided demographic data and hospital-assigned identification number Time out: Immediately prior to procedure a "time out" was called to verify the correct patient, procedure, equipment, support staff and site/side marked as required.  Indications: CODE BLUE in ICU  Intubation method: Glidescope Laryngoscopy   Preoxygenation: 100% BVM  Sedatives: none Paralytic: none  Tube Size: 7.5 cm cuffed  Post-procedure assessment: chest rise and ETCO2 monitor Breath sounds: equal and absent over the epigastrium Tube secured with: ETT holder Chest x-ray pending  Chest x-ray findings: pending  Patient tolerated the procedure well with no immediate complications.     Vanetta MuldersScott Verneda Hollopeter, MD 07/12/2016 1524

## 2016-07-26 NOTE — Progress Notes (Signed)
1440 This Clinical research associatewriter entered patient's room to answer patient's call bell. Patient noted lying in the bed on his RIGHT side with foam like secretions on the corner of his mouth, making gasps for air, slightly tremoring and pupils noted constricted and fixed (unreactive to light pen). Patient then noted to stop breathing. Code blue called and CPR initiated.   1501 patient pronounced dead after CPR failed. Jesse Mccarthy called and notified @ 1520, patient's daughter Toniann FailWendy Major notified and made aware.

## 2016-07-26 NOTE — Progress Notes (Signed)
Subjective: This is late entry of a progress note accordingly to the patient evaluation this morning at 8 Am.  Patient is alert, awake  and feels better. He is eating his break fast. His breathing is better. He has Foley catheter in place,  Objective: Vital signs in last 24 hours: Temp:  [97.3 F (36.3 C)-98.9 F (37.2 C)] 97.5 F (36.4 C) (11/27 1100) Pulse Rate:  [92-100] 92 (11/27 0000) Resp:  [20-34] 27 (11/27 0000) BP: (86-103)/(62-74) 103/74 (11/27 0000) SpO2:  [91 %-100 %] 91 % (11/27 0000) Weight:  [69.1 kg (152 lb 5.4 oz)] 69.1 kg (152 lb 5.4 oz) (11/27 0534) Weight change: 0.9 kg (1 lb 15.8 oz) Last BM Date: 07/19/16  Intake/Output from previous day: 11/26 0701 - 11/27 0700 In: 1996 [I.V.:1930; IV Piggyback:66] Out: 1250 [Urine:1250]  PHYSICAL EXAM General appearance: alert and no distress Resp: clear to auscultation bilaterally Cardio: S1, S2 normal GI: soft, non-tender; bowel sounds normal; no masses,  no organomegaly Extremities: extremities normal, atraumatic, no cyanosis or edema  Lab Results:  Results for orders placed or performed during the hospital encounter of 07/19/2016 (from the past 48 hour(s))  MRSA PCR Screening     Status: None   Collection Time: 07/20/16  6:41 PM  Result Value Ref Range   MRSA by PCR NEGATIVE NEGATIVE    Comment:        The GeneXpert MRSA Assay (FDA approved for NASAL specimens only), is one component of a comprehensive MRSA colonization surveillance program. It is not intended to diagnose MRSA infection nor to guide or monitor treatment for MRSA infections.   Basic metabolic panel     Status: Abnormal   Collection Time: 07/21/16  4:43 AM  Result Value Ref Range   Sodium 138 135 - 145 mmol/L   Potassium 4.0 3.5 - 5.1 mmol/L   Chloride 106 101 - 111 mmol/L   CO2 20 (L) 22 - 32 mmol/L   Glucose, Bld 117 (H) 65 - 99 mg/dL   BUN 45 (H) 6 - 20 mg/dL   Creatinine, Ser 3.63 (H) 0.61 - 1.24 mg/dL   Calcium 8.3 (L) 8.9 - 10.3  mg/dL   GFR calc non Af Amer 15 (L) >60 mL/min   GFR calc Af Amer 17 (L) >60 mL/min    Comment: (NOTE) The eGFR has been calculated using the CKD EPI equation. This calculation has not been validated in all clinical situations. eGFR's persistently <60 mL/min signify possible Chronic Kidney Disease.    Anion gap 12 5 - 15  CBC     Status: Abnormal   Collection Time: 07/21/16  4:43 AM  Result Value Ref Range   WBC 7.3 4.0 - 10.5 K/uL   RBC 3.21 (L) 4.22 - 5.81 MIL/uL   Hemoglobin 9.9 (L) 13.0 - 17.0 g/dL   HCT 30.0 (L) 39.0 - 52.0 %   MCV 93.5 78.0 - 100.0 fL   MCH 30.8 26.0 - 34.0 pg   MCHC 33.0 30.0 - 36.0 g/dL   RDW 17.9 (H) 11.5 - 15.5 %   Platelets 220 150 - 400 K/uL  Renal function panel     Status: Abnormal   Collection Time: 07/21/16  4:45 AM  Result Value Ref Range   Sodium 138 135 - 145 mmol/L   Potassium 4.0 3.5 - 5.1 mmol/L   Chloride 105 101 - 111 mmol/L   CO2 20 (L) 22 - 32 mmol/L   Glucose, Bld 117 (H) 65 - 99 mg/dL  BUN 44 (H) 6 - 20 mg/dL   Creatinine, Ser 3.66 (H) 0.61 - 1.24 mg/dL   Calcium 8.3 (L) 8.9 - 10.3 mg/dL   Phosphorus 5.0 (H) 2.5 - 4.6 mg/dL   Albumin 2.8 (L) 3.5 - 5.0 g/dL   GFR calc non Af Amer 15 (L) >60 mL/min   GFR calc Af Amer 17 (L) >60 mL/min    Comment: (NOTE) The eGFR has been calculated using the CKD EPI equation. This calculation has not been validated in all clinical situations. eGFR's persistently <60 mL/min signify possible Chronic Kidney Disease.    Anion gap 13 5 - 15  Culture, blood (Routine X 2) w Reflex to ID Panel     Status: None (Preliminary result)   Collection Time: 07/21/16  9:32 AM  Result Value Ref Range   Specimen Description BLOOD    Special Requests NONE    Culture NO GROWTH < 24 HOURS    Report Status PENDING   Culture, blood (Routine X 2) w Reflex to ID Panel     Status: None (Preliminary result)   Collection Time: 07/21/16  9:36 AM  Result Value Ref Range   Specimen Description BLOOD    Special  Requests NONE    Culture NO GROWTH < 24 HOURS    Report Status PENDING   Urinalysis, Routine w reflex microscopic (not at [facility])     Status: Abnormal   Collection Time: 07/21/16  1:41 PM  Result Value Ref Range   Color, Urine YELLOW YELLOW   APPearance HAZY (A) CLEAR   Specific Gravity, Urine 1.010 1.005 - 1.030   pH 5.5 5.0 - 8.0   Glucose, UA NEGATIVE NEGATIVE mg/dL   Hgb urine dipstick LARGE (A) NEGATIVE   Bilirubin Urine NEGATIVE NEGATIVE   Ketones, ur NEGATIVE NEGATIVE mg/dL   Protein, ur NEGATIVE NEGATIVE mg/dL   Nitrite NEGATIVE NEGATIVE   Leukocytes, UA MODERATE (A) NEGATIVE  Urine microscopic-add on     Status: Abnormal   Collection Time: 07/21/16  1:41 PM  Result Value Ref Range   Squamous Epithelial / LPF 0-5 (A) NONE SEEN   WBC, UA 6-30 0 - 5 WBC/hpf   RBC / HPF 6-30 0 - 5 RBC/hpf   Bacteria, UA MANY (A) NONE SEEN  Renal function panel     Status: Abnormal   Collection Time: 08-11-2016  4:06 AM  Result Value Ref Range   Sodium 139 135 - 145 mmol/L   Potassium 4.2 3.5 - 5.1 mmol/L   Chloride 106 101 - 111 mmol/L   CO2 19 (L) 22 - 32 mmol/L   Glucose, Bld 163 (H) 65 - 99 mg/dL   BUN 50 (H) 6 - 20 mg/dL   Creatinine, Ser 3.63 (H) 0.61 - 1.24 mg/dL   Calcium 8.2 (L) 8.9 - 10.3 mg/dL   Phosphorus 6.2 (H) 2.5 - 4.6 mg/dL   Albumin 2.8 (L) 3.5 - 5.0 g/dL   GFR calc non Af Amer 15 (L) >60 mL/min   GFR calc Af Amer 17 (L) >60 mL/min    Comment: (NOTE) The eGFR has been calculated using the CKD EPI equation. This calculation has not been validated in all clinical situations. eGFR's persistently <60 mL/min signify possible Chronic Kidney Disease.    Anion gap 14 5 - 15    ABGS No results for input(s): PHART, PO2ART, TCO2, HCO3 in the last 72 hours.  Invalid input(s): PCO2 CULTURES Recent Results (from the past 240 hour(s))  MRSA PCR Screening  Status: None   Collection Time: 07/20/16  6:41 PM  Result Value Ref Range Status   MRSA by PCR NEGATIVE NEGATIVE  Final    Comment:        The GeneXpert MRSA Assay (FDA approved for NASAL specimens only), is one component of a comprehensive MRSA colonization surveillance program. It is not intended to diagnose MRSA infection nor to guide or monitor treatment for MRSA infections.   Culture, blood (Routine X 2) w Reflex to ID Panel     Status: None (Preliminary result)   Collection Time: 07/21/16  9:32 AM  Result Value Ref Range Status   Specimen Description BLOOD  Final   Special Requests NONE  Final   Culture NO GROWTH < 24 HOURS  Final   Report Status PENDING  Incomplete  Culture, blood (Routine X 2) w Reflex to ID Panel     Status: None (Preliminary result)   Collection Time: 07/21/16  9:36 AM  Result Value Ref Range Status   Specimen Description BLOOD  Final   Special Requests NONE  Final   Culture NO GROWTH < 24 HOURS  Final   Report Status PENDING  Incomplete   Studies/Results: Dg Chest 1 View  Result Date: 07/20/2016 CLINICAL DATA:  Labored respiration EXAM: CHEST 1 VIEW COMPARISON:  07/19/2016 FINDINGS: Borderline cardiomegaly. Central vascular congestion. Hazy airspace disease and interstitial prominence bilateral perihilar and lower lobes highly suspicious for pulmonary edema. Less likely bilateral pneumonia. Probable small bilateral pleural effusion. IMPRESSION: Central vascular congestion. Hazy airspace disease and interstitial prominence bilateral perihilar and lower lobes highly suspicious for pulmonary edema. Less likely bilateral pneumonia. Probable small bilateral pleural effusion. Electronically Signed   By: Lahoma Crocker M.D.   On: 07/20/2016 16:00   US Renal  Result Date: 07/21/2016 CLINICAL DATA:  75 year old male with acute on chronic renal failure EXAM: RENAL / URINARY TRACT ULTRASOUND COMPLETE COMPARISON:  Prior renal ultrasound 07/18/2016 FINDINGS: Right Kidney: Length: 11.4 cm. No evidence of hydronephrosis. Duplicated renal collecting system again noted. Left Kidney:  Not well seen.  Extensive bowel gas obscures the kidney. Bladder: Foley catheter in the collapsed bladder. Other: Interval development of small volume ascites and left pleural effusion. IMPRESSION: 1. The left kidney is not well seen secondary to obscuring bowel gas. 2. No evidence of right-sided hydronephrosis. 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 3. Interval development of small peritoneal ascites and a left pleural effusion. Electronically Signed   By: Jacqulynn Cadet M.D.   On: 07/21/2016 15:32    Medications: I have reviewed the patient's current medications.  Assesment:   Principal Problem:   AKI (acute kidney injury) ([place]) Active Problems:   Gout   BPH (benign prostatic hyperplasia)   Bilateral leg edema   Volume depletion    Plan:  Medications reviewed Patient condition discussed with Dr. Lowanda Foster Will continue to monitor BMP Will continue current treatment according nephrology and urology recommendation   LOS: 5 days   Cynitha Berte 08-07-16, 3:08 PM

## 2016-07-26 NOTE — Progress Notes (Signed)
Subjective:  Patient says that his breathing is better this morning. He doesn't have any cough but he has sore throat.  Objective: Vital signs in last 24 hours: Temp:  [97.3 F (36.3 C)-98.9 F (37.2 C)] 97.3 F (36.3 C) (11/27 0400) Pulse Rate:  [92-100] 92 (11/27 0000) Resp:  [20-34] 27 (11/27 0000) BP: (86-103)/(62-74) 103/74 (11/27 0000) SpO2:  [91 %-100 %] 91 % (11/27 0000) Weight:  [69.1 kg (152 lb 5.4 oz)] 69.1 kg (152 lb 5.4 oz) (11/27 0534) Weight change: 0.9 kg (1 lb 15.8 oz)  Intake/Output from previous day: 11/26 0701 - 11/27 0700 In: 1996 [I.V.:1930; IV Piggyback:66] Out: 1250 [Urine:1250] Intake/Output this shift: No intake/output data recorded.   EXAM: General appearance: Alert, cooperative, in no apparent distress Resp:  Clear to auscultation bilaterally Cardio:  RRR without murmur or rub GI: +BS, soft and nontender Extremities: Trace edema  Lab Results:  Recent Labs  07/20/16 0636 07/21/16 0443  WBC 7.4 7.3  HGB 10.4* 9.9*  HCT 31.8* 30.0*  PLT 244 220   BMET:   Recent Labs  07/21/16 0445 10-07-2015 0406  NA 138 139  K 4.0 4.2  CL 105 106  CO2 20* 19*  GLUCOSE 117* 163*  BUN 44* 50*  CREATININE 3.66* 3.63*  CALCIUM 8.3* 8.2*  ALBUMIN 2.8* 2.8*   No results for input(s): PTH in the last 72 hours. Iron Studies: No results for input(s): IRON, TIBC, TRANSFERRIN, FERRITIN in the last 72 hours.  Studies/Results: [place]  Result Date: 07/21/2016 CLINICAL DATA:  75 year old male with acute on chronic renal failure EXAM: RENAL / URINARY TRACT ULTRASOUND COMPLETE COMPARISON:  Prior renal ultrasound 07/18/2016 FINDINGS: Right Kidney: Length: 11.4 cm. No evidence of hydronephrosis. Duplicated renal collecting system again noted. Left Kidney: Not well seen.  Extensive bowel gas obscures the kidney. Bladder: Foley catheter in the collapsed bladder. Other: Interval development of small volume ascites and left pleural effusion. IMPRESSION: 1. The left  kidney is not well seen secondary to obscuring bowel gas. 2. No evidence of right-sided hydronephrosis. 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 3. Interval development of small peritoneal ascites and a left pleural effusion. Electronically Signed   By: Malachy MoanHeath  McCullough M.D.   On: 07/21/2016 15:32     Assessment/Plan: 1. Acute kidney injury superimposed on chronic - . The etiology could be prerenal/ATN/obstructive uropathy. Status post Foley catheter placement. He had 1200 mL of urine output. His renal function is showing slight improvement. Patient does not have any uremic signs and symptoms. 2. Hypotension - Possibly from dehydration. His blood pressure is somewhat better. 3. Hyperkalemia - His potassium has corrected. 4. History of gout -presently no recent activation. 5. Metabolic bone disease: His calcium, corrected 9.4, and phosphorus are in range. 6. Possible metabolic acidosis - Patient CO2 is slightly low. 7. History of CHF: Patient was having some difficulty breathing and breathing yesterday. Is feeling much better now.  Plan:  1. We'll continue with slow hydration.            2. We'll check his renal panel in the morning.   LOS: 5 days   Niam Nepomuceno S Jun 13, 2016,8:09 AM

## 2016-07-26 DEATH — deceased

## 2016-08-26 NOTE — Discharge Summary (Signed)
Physician Discharge Summary  Patient ID: Jesse Mccarthy MRN: [id number] DOB/AGE: 04-15-1941 76 y.o. Primary Care Physician:Gennell How, MD Admit date: 06/27/2016 Date expiration: 2016-03-11   Final Diagnosis: Sudden cardiac death Pneumonia Acute on chronic respiratory failure  AKI (acute kidney injury) ([facility]) Active Problems:   Gout   BPH (benign prostatic hyperplasia)   Bilateral leg edema   Volume depletion shortness of breath  Hospital Course: This was a 76 years old male with history of multiple medical illnesses who admitted due to hypotension and generalized weakness. Patient also had pain and swelling of his knees. Patient is a known case of gouty arthritis and unable to ambulate due to the pain. On admission patient found significant acute renal failure, hypotension and   Lower extremities swelling. Patient was started on IV fluid and nephrology consult was done. He was continued on IV hydration but developed acute urine retention. He was evaluated by urology and foley catheter was inserted. Patient developed  Shortness of breath and hypoxemia while he was on the floor. Chest x-ray showed interstitial prominence suspicious of pneumonia. Patient was started on IV antibiotics and was transferred to ICU. He was also given IV steroid due to his story of tobacco smoking and hypoxemia. After the treatment patient improved and was aible to take oral feeding. However, around noon patient suddenly become brady cardiac and hypoxic. Code was called and patient was treated to resuscitated according to ACLS protocol but was not successful after 20 minute attempt. Patient was pronounced expired and discussed with his daughter.     Signed: Humaira Sculley   08/07/2016, 8:16 AM

## 2017-08-11 IMAGING — CR DG CHEST 1V
2 series · 2 of 2 positions shown · non-contrast
Comparison: 07/17/2016

CLINICAL DATA: Labored respiration

EXAM:
CHEST 1 VIEW

[ap (1 of 2)]
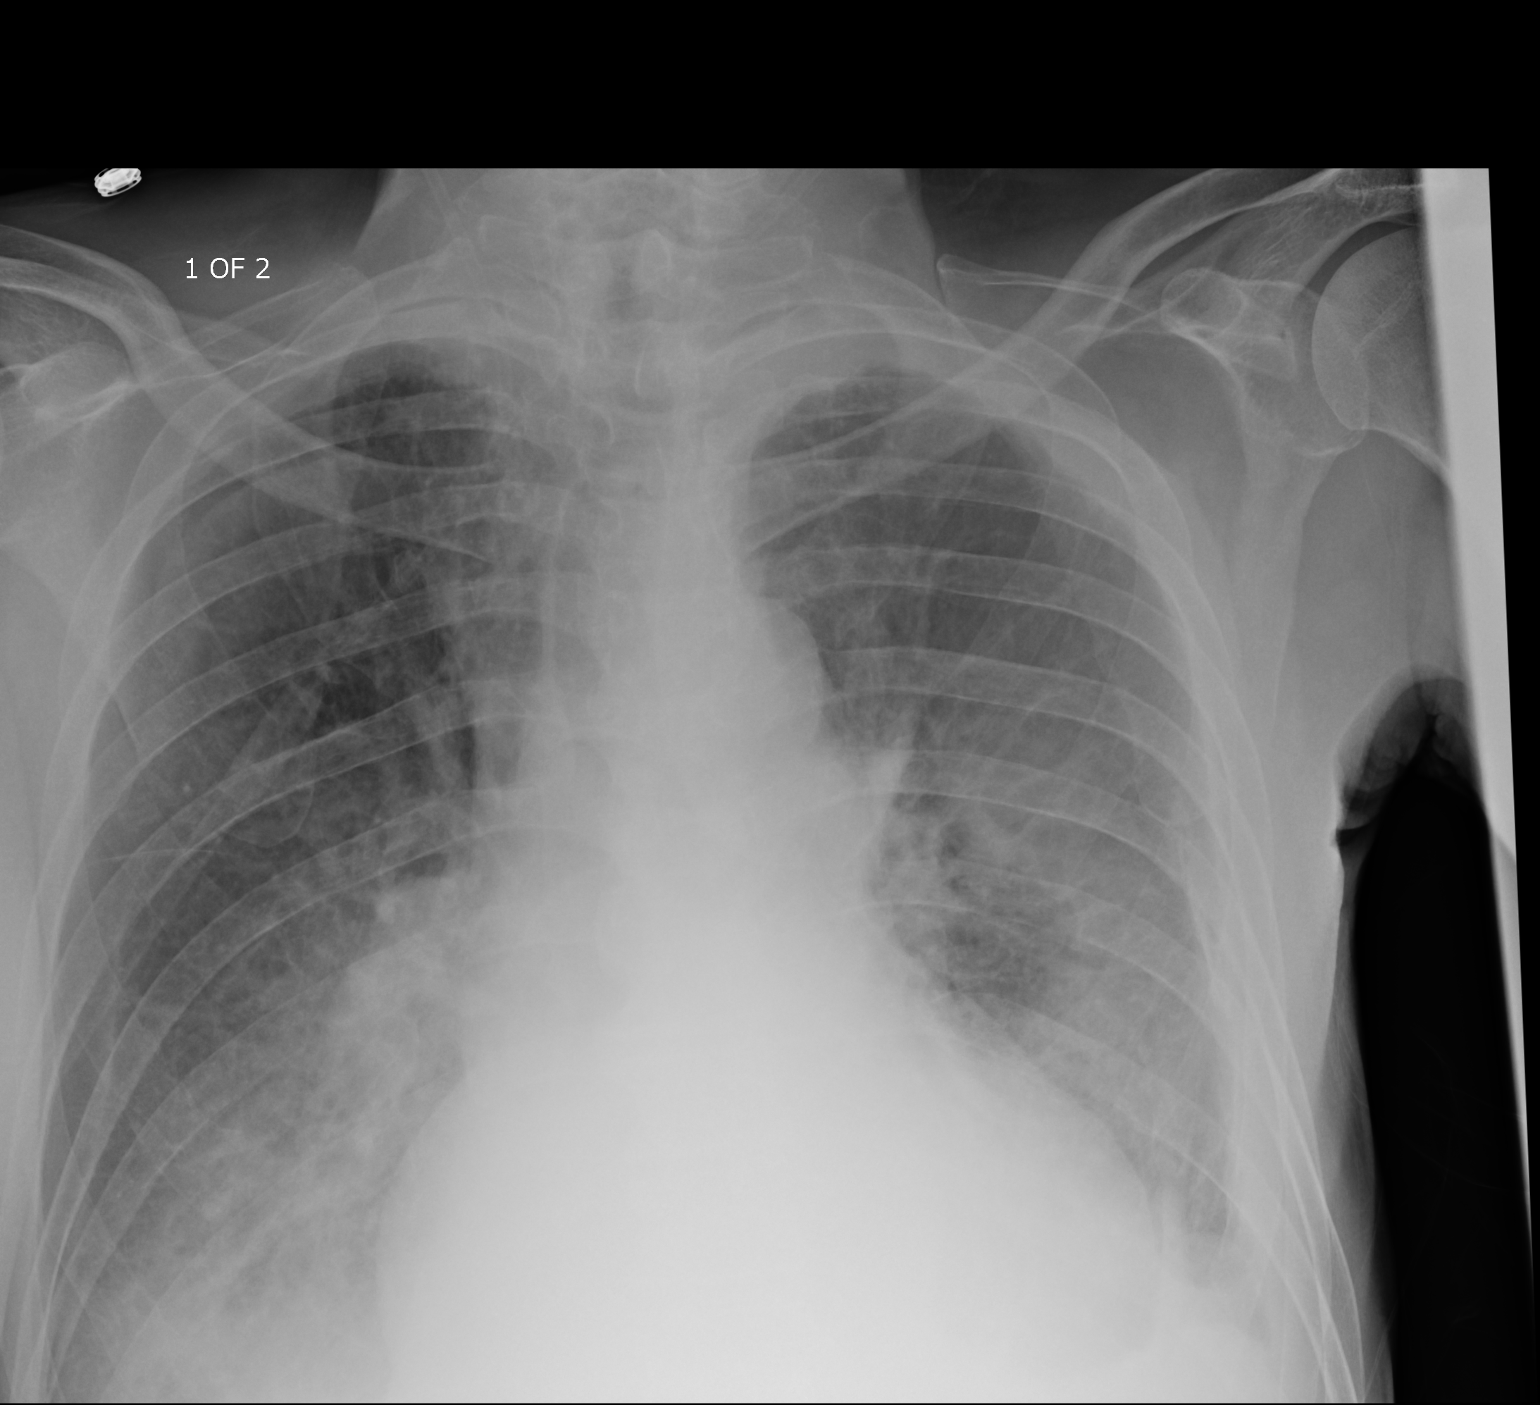

[ap (2 of 2)]
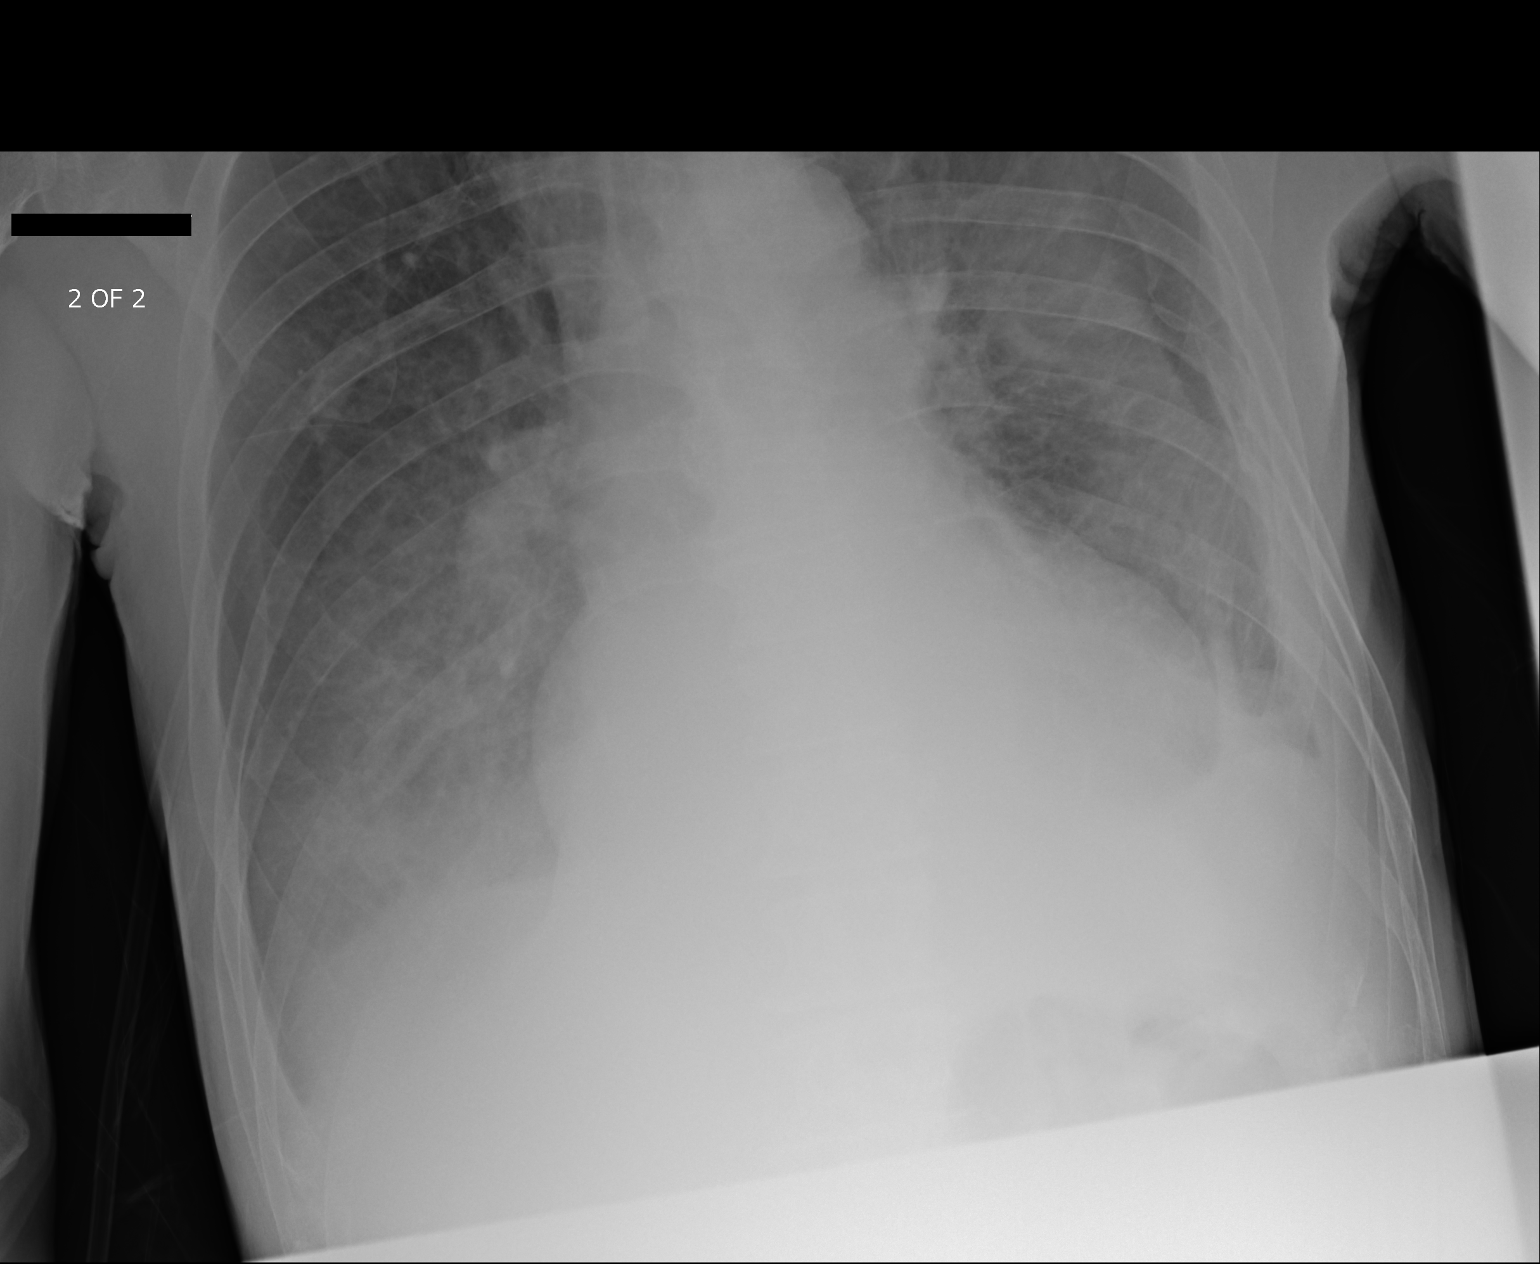

[2 of 2 positions shown; findings below may reference images not displayed]

FINDINGS: Borderline cardiomegaly. Central vascular congestion. Hazy airspace
disease and interstitial prominence bilateral perihilar and lower
lobes highly suspicious for pulmonary edema. Less likely bilateral
pneumonia. Probable small bilateral pleural effusion.
IMPRESSION: Central vascular congestion. Hazy airspace disease and interstitial
prominence bilateral perihilar and lower lobes highly suspicious for
pulmonary edema. Less likely bilateral pneumonia. Probable small
bilateral pleural effusion.

## 2018-09-12 IMAGING — US US RENAL
1 series · 14 of 25 positions shown · non-contrast
Comparison: Ultrasound 01/04/1959

CLINICAL DATA: acute renal injury

EXAM:
RENAL / URINARY TRACT ULTRASOUND COMPLETE

[Series 1: us renal · 0.22mm/px · 14 of 139 slices shown]
[im 1/139]
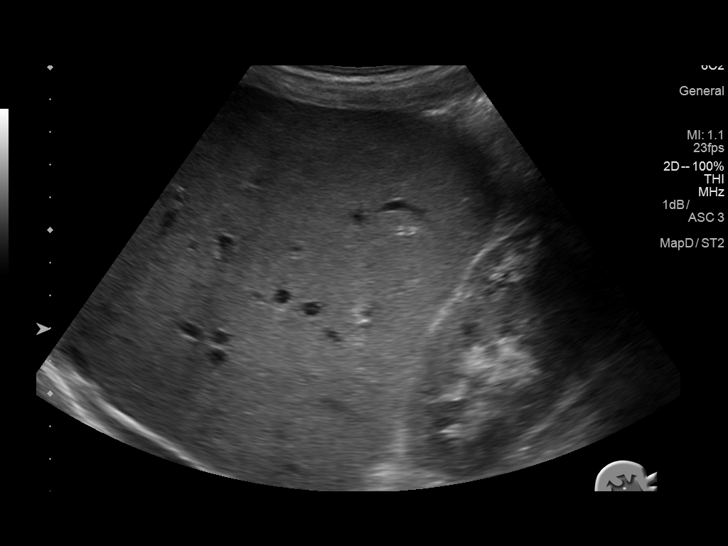
[im 12/139]
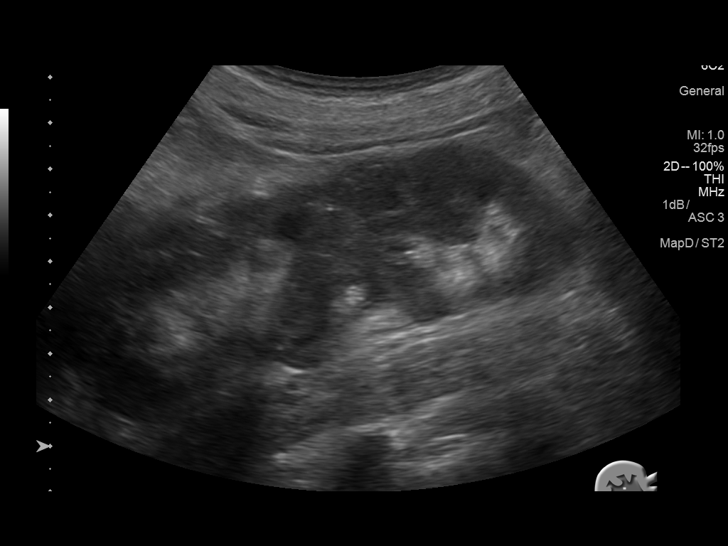
[im 24/139]
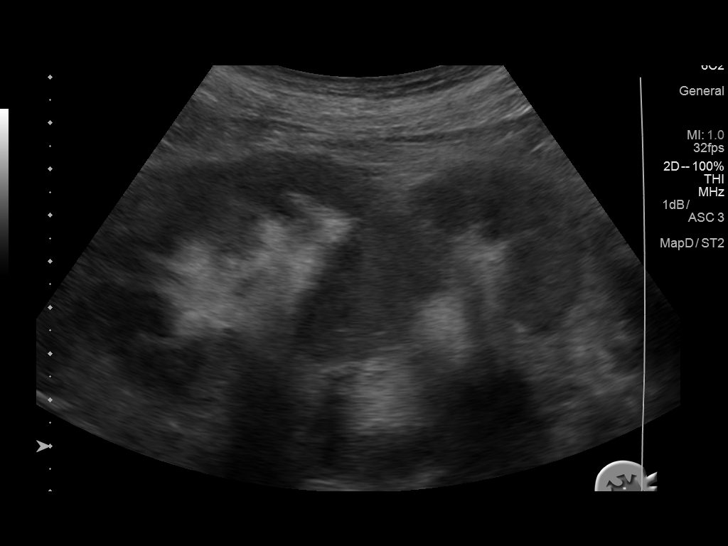
[im 35/139]
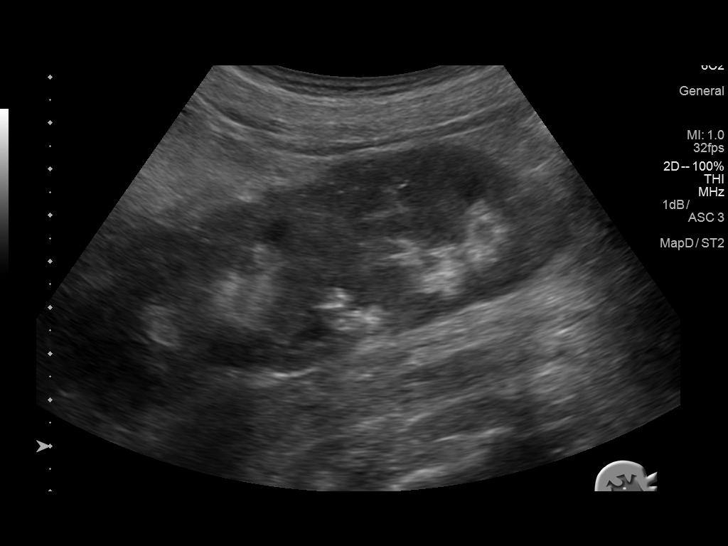
[im 47/139]
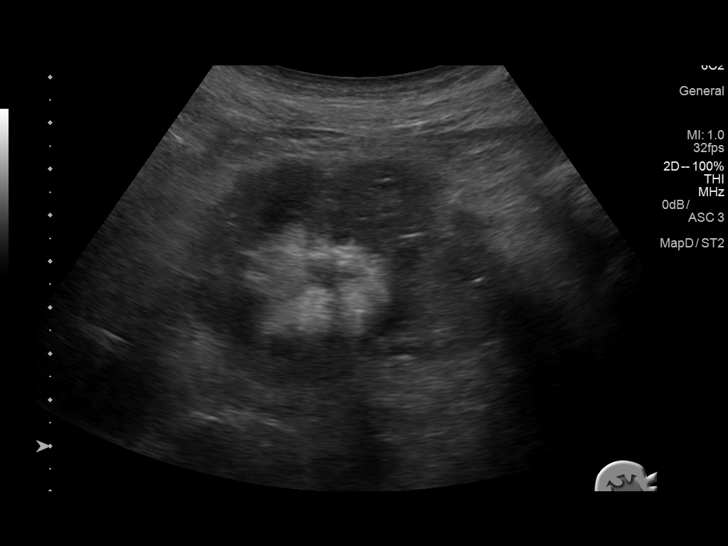
[im 52/139]
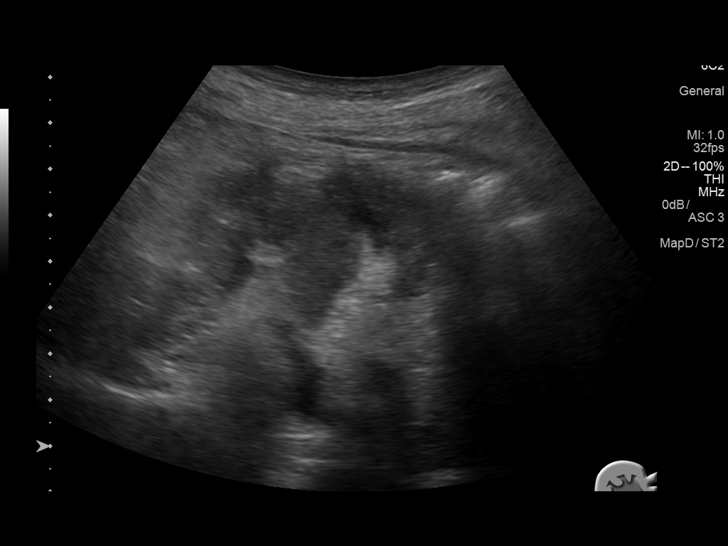
[im 64/139]
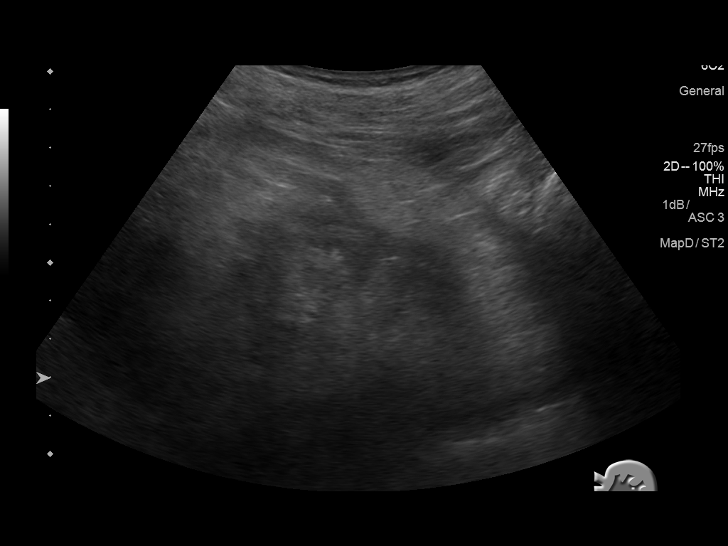
[im 75/139]
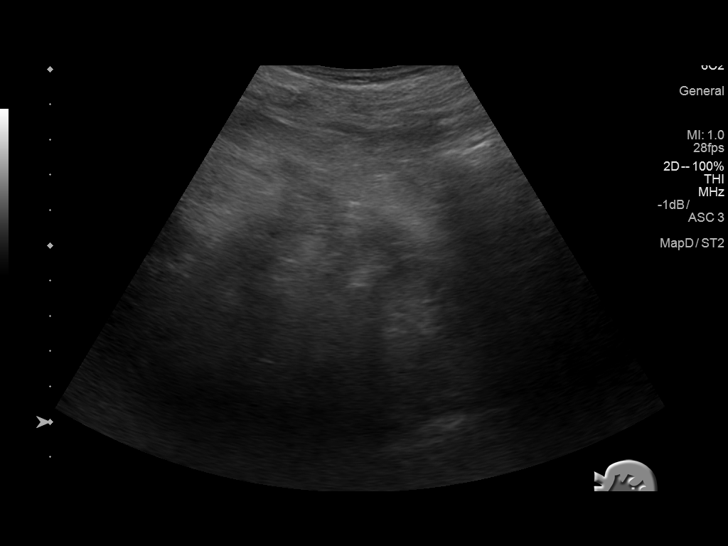
[im 87/139]
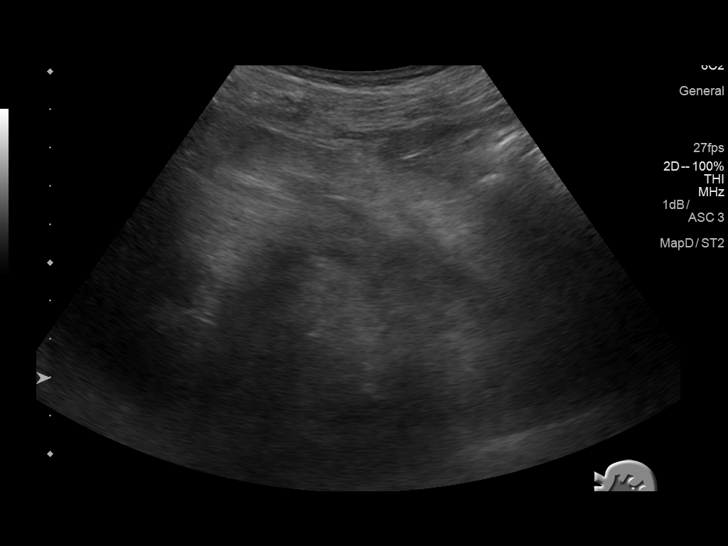
[im 93/139]
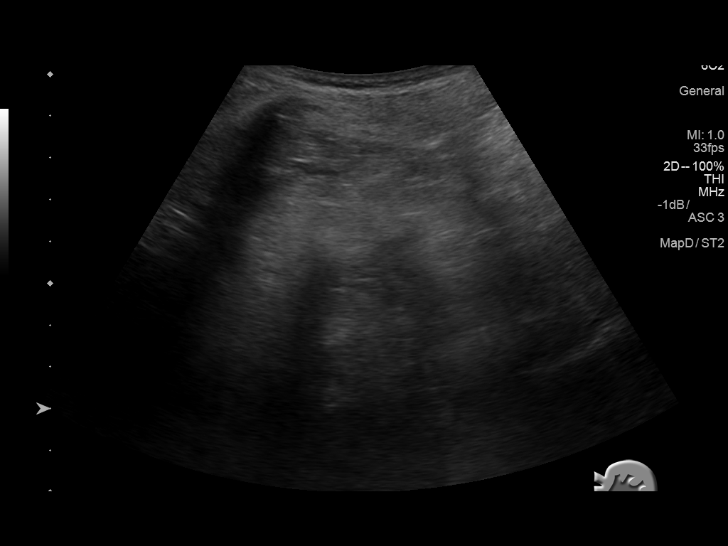
[im 104/139]
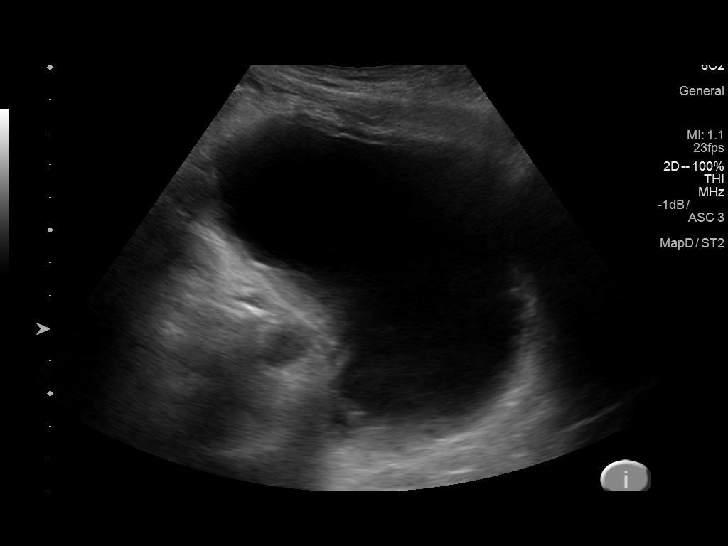
[im 116/139]
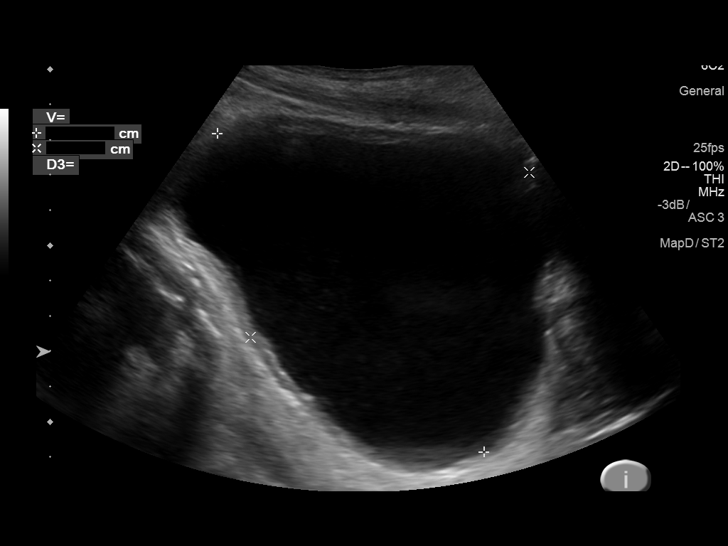
[im 127/139]
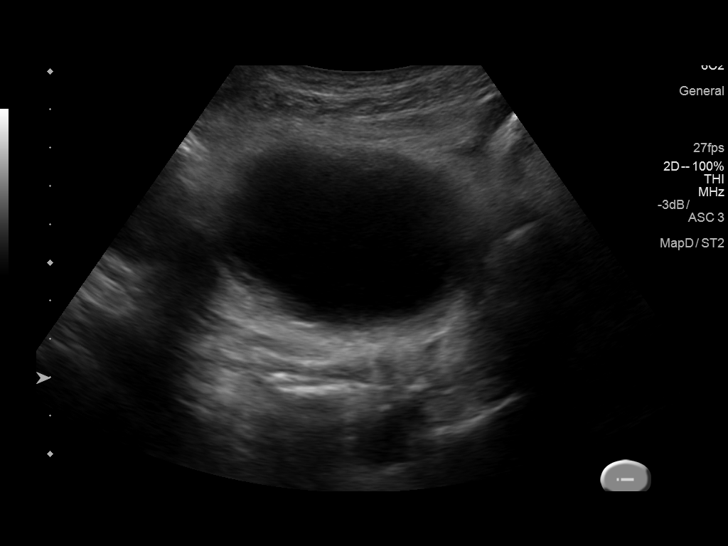
[im 139/139]
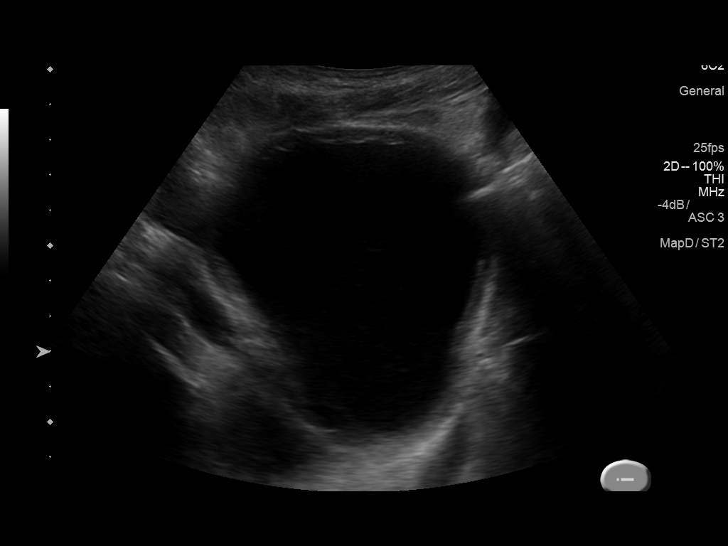

[14 of 25 positions shown; findings below may reference images not displayed]

FINDINGS: Right Kidney:

Length: 11.7 cm. Echogenicity within normal limits. No mass or
hydronephrosis visualized. [place] collecting system

Left Kidney:

Length: 8.2 cm. Cortical thinning with increased echogenicity.
Previously LEFT kidney not identified.

Bladder:

Mild bladder wall thickening. Small amount of debris within the
bladder.
IMPRESSION: 1. No RIGHT hydronephrosis.  Duplicated collecting system
2. Questionable LEFT kidney identified. Previous ultrasounds did not
identified LEFT kidney
3. Thick-walled bladder or debris. Correlate clinically for
cystitis.
